# Patient Record
Sex: Female | Born: 1965 | Race: White | Hispanic: No | Smoking: Former smoker
Health system: Southern US, Community
[De-identification: ages and names within clinical notes are randomized; demographics above are authoritative.]

## PROBLEM LIST (undated history)

## (undated) DIAGNOSIS — M1990 Unspecified osteoarthritis, unspecified site: Secondary | ICD-10-CM

## (undated) DIAGNOSIS — F419 Anxiety disorder, unspecified: Secondary | ICD-10-CM

## (undated) DIAGNOSIS — I119 Hypertensive heart disease without heart failure: Secondary | ICD-10-CM

## (undated) DIAGNOSIS — K219 Gastro-esophageal reflux disease without esophagitis: Secondary | ICD-10-CM

## (undated) DIAGNOSIS — Z8719 Personal history of other diseases of the digestive system: Secondary | ICD-10-CM

## (undated) DIAGNOSIS — K259 Gastric ulcer, unspecified as acute or chronic, without hemorrhage or perforation: Secondary | ICD-10-CM

## (undated) DIAGNOSIS — I10 Essential (primary) hypertension: Secondary | ICD-10-CM

## (undated) DIAGNOSIS — E785 Hyperlipidemia, unspecified: Secondary | ICD-10-CM

## (undated) DIAGNOSIS — I2510 Atherosclerotic heart disease of native coronary artery without angina pectoris: Secondary | ICD-10-CM

## (undated) DIAGNOSIS — Z720 Tobacco use: Secondary | ICD-10-CM

## (undated) DIAGNOSIS — K2270 Barrett's esophagus without dysplasia: Secondary | ICD-10-CM

## (undated) HISTORY — DX: Anxiety disorder, unspecified: F41.9

---
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
## 1988-08-16 HISTORY — PX: TUBAL LIGATION: SHX77

## 1997-12-19 ENCOUNTER — Emergency Department (HOSPITAL_COMMUNITY): Admission: EM | Admit: 1997-12-19 | Discharge: 1997-12-19 | Payer: Self-pay | Admitting: Cardiology

## 2002-12-24 ENCOUNTER — Encounter: Payer: Self-pay | Admitting: Emergency Medicine

## 2002-12-24 ENCOUNTER — Emergency Department (HOSPITAL_COMMUNITY): Admission: EM | Admit: 2002-12-24 | Discharge: 2002-12-24 | Payer: Self-pay | Admitting: Emergency Medicine

## 2004-10-23 ENCOUNTER — Emergency Department (HOSPITAL_COMMUNITY): Admission: EM | Admit: 2004-10-23 | Discharge: 2004-10-23 | Payer: Self-pay | Admitting: Emergency Medicine

## 2004-10-25 ENCOUNTER — Emergency Department (HOSPITAL_COMMUNITY): Admission: EM | Admit: 2004-10-25 | Discharge: 2004-10-25 | Payer: Self-pay | Admitting: Emergency Medicine

## 2011-03-10 ENCOUNTER — Emergency Department: Payer: Self-pay | Admitting: Emergency Medicine

## 2011-03-15 ENCOUNTER — Ambulatory Visit: Payer: Self-pay | Admitting: Internal Medicine

## 2012-03-03 ENCOUNTER — Emergency Department (HOSPITAL_COMMUNITY): Payer: Self-pay

## 2012-03-03 ENCOUNTER — Encounter (HOSPITAL_COMMUNITY): Payer: Self-pay | Admitting: *Deleted

## 2012-03-03 ENCOUNTER — Emergency Department (HOSPITAL_COMMUNITY)
Admission: EM | Admit: 2012-03-03 | Discharge: 2012-03-03 | Disposition: A | Discharge status: Home / Self Care | Payer: Self-pay | Attending: Emergency Medicine | Admitting: Emergency Medicine

## 2012-03-03 DIAGNOSIS — X58XXXA Exposure to other specified factors, initial encounter: Secondary | ICD-10-CM | POA: Insufficient documentation

## 2012-03-03 DIAGNOSIS — S139XXA Sprain of joints and ligaments of unspecified parts of neck, initial encounter: Secondary | ICD-10-CM | POA: Insufficient documentation

## 2012-03-03 DIAGNOSIS — S161XXA Strain of muscle, fascia and tendon at neck level, initial encounter: Secondary | ICD-10-CM

## 2012-03-03 DIAGNOSIS — F17200 Nicotine dependence, unspecified, uncomplicated: Secondary | ICD-10-CM | POA: Insufficient documentation

## 2012-03-03 DIAGNOSIS — I10 Essential (primary) hypertension: Secondary | ICD-10-CM | POA: Insufficient documentation

## 2012-03-03 HISTORY — DX: Essential (primary) hypertension: I10

## 2012-03-03 MED ORDER — DIAZEPAM 5 MG PO TABS: 5.0000 mg | ORAL_TABLET | Freq: Three times a day (TID) | ORAL | Status: AC | PRN

## 2012-03-03 MED ORDER — IBUPROFEN 800 MG PO TABS
800.0000 mg | ORAL_TABLET | Freq: Once | ORAL | Status: AC
  Administered 2012-03-03: 800 mg via ORAL
  Filled 2012-03-03: qty 1

## 2012-03-03 MED ORDER — IBUPROFEN 600 MG PO TABS: 600.0000 mg | ORAL_TABLET | Freq: Four times a day (QID) | ORAL | Status: AC | PRN

## 2012-03-03 MED ORDER — HYDROCODONE-ACETAMINOPHEN 5-325 MG PO TABS: 1.0000 | ORAL_TABLET | ORAL | Status: AC | PRN

## 2012-03-03 MED ORDER — HYDROCODONE-ACETAMINOPHEN 5-325 MG PO TABS
1.0000 | ORAL_TABLET | Freq: Once | ORAL | Status: AC
  Administered 2012-03-03: 1 via ORAL
  Filled 2012-03-03: qty 1

## 2012-03-03 NOTE — ED Notes (Signed)
Family at bedside. 

## 2012-03-03 NOTE — ED Notes (Signed)
Pain in neck that radiates to head and rt shoulder.  Push mowing yesterday and felt a pop in her neck and had onset of pain.
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
## 2012-03-07 NOTE — ED Provider Notes (Signed)
Medical screening examination/treatment/procedure(s) were performed by non-physician practitioner and as supervising physician I was immediately available for consultation/collaboration.   Shelda Jakes, MD 03/07/12 2157

## 2012-08-16 HISTORY — PX: TOTAL ABDOMINAL HYSTERECTOMY: SHX209

## 2012-09-30 ENCOUNTER — Emergency Department (HOSPITAL_COMMUNITY)
Admission: EM | Admit: 2012-09-30 | Discharge: 2012-09-30 | Disposition: A | Discharge status: Home / Self Care | Payer: BC Managed Care – PPO | Attending: Emergency Medicine | Admitting: Emergency Medicine

## 2012-09-30 ENCOUNTER — Encounter (HOSPITAL_COMMUNITY): Payer: Self-pay | Admitting: Emergency Medicine

## 2012-09-30 ENCOUNTER — Emergency Department (HOSPITAL_COMMUNITY): Payer: BC Managed Care – PPO

## 2012-09-30 DIAGNOSIS — I10 Essential (primary) hypertension: Secondary | ICD-10-CM | POA: Insufficient documentation

## 2012-09-30 DIAGNOSIS — J40 Bronchitis, not specified as acute or chronic: Secondary | ICD-10-CM

## 2012-09-30 DIAGNOSIS — J3489 Other specified disorders of nose and nasal sinuses: Secondary | ICD-10-CM | POA: Insufficient documentation

## 2012-09-30 DIAGNOSIS — R093 Abnormal sputum: Secondary | ICD-10-CM | POA: Insufficient documentation

## 2012-09-30 DIAGNOSIS — F17200 Nicotine dependence, unspecified, uncomplicated: Secondary | ICD-10-CM | POA: Insufficient documentation

## 2012-09-30 DIAGNOSIS — R51 Headache: Secondary | ICD-10-CM | POA: Insufficient documentation

## 2012-09-30 DIAGNOSIS — J029 Acute pharyngitis, unspecified: Secondary | ICD-10-CM | POA: Insufficient documentation

## 2012-09-30 DIAGNOSIS — R110 Nausea: Secondary | ICD-10-CM | POA: Insufficient documentation

## 2012-09-30 DIAGNOSIS — R0789 Other chest pain: Secondary | ICD-10-CM | POA: Insufficient documentation

## 2012-09-30 DIAGNOSIS — J209 Acute bronchitis, unspecified: Secondary | ICD-10-CM | POA: Insufficient documentation

## 2012-09-30 DIAGNOSIS — H9209 Otalgia, unspecified ear: Secondary | ICD-10-CM | POA: Insufficient documentation

## 2012-09-30 DIAGNOSIS — R109 Unspecified abdominal pain: Secondary | ICD-10-CM | POA: Insufficient documentation

## 2012-09-30 DIAGNOSIS — R42 Dizziness and giddiness: Secondary | ICD-10-CM | POA: Insufficient documentation

## 2012-09-30 DIAGNOSIS — R197 Diarrhea, unspecified: Secondary | ICD-10-CM | POA: Insufficient documentation

## 2012-09-30 DIAGNOSIS — Z79899 Other long term (current) drug therapy: Secondary | ICD-10-CM | POA: Insufficient documentation

## 2012-09-30 MED ORDER — AZITHROMYCIN 250 MG PO TABS: 250.0000 mg | ORAL_TABLET | Freq: Every day | ORAL | Status: DC

## 2012-09-30 MED ORDER — GUAIFENESIN-CODEINE 100-10 MG/5ML PO SYRP: 5.0000 mL | ORAL_SOLUTION | Freq: Three times a day (TID) | ORAL | Status: DC | PRN

## 2012-09-30 NOTE — ED Provider Notes (Signed)
Medical screening examination/treatment/procedure(s) were performed by non-physician practitioner and as supervising physician I was immediately available for consultation/collaboration. Devoria Albe, MD, Armando Gang   Ward Givens, MD 09/30/12 [phone]

## 2012-09-30 NOTE — ED Notes (Signed)
Pt c/o productive cough with yellow phlegm and ear pain x 4 days

## 2012-09-30 NOTE — ED Provider Notes (Signed)
History     CSN: [id number]  Arrival date & time 09/30/12  4098   First MD Initiated Contact with Patient 09/30/12 1008      Chief Complaint  Patient presents with  . Cough    HPI Chauna Osoria is a 47 y.o. female who presents to the ED with a cough. The cough started 4 days ago. She describes the cough as deep productive with yellow/green sputum. Coughs until she feels like she is going to vomit. Associated symptoms include nasal congestion, watery eyes and ear ache. Has had similar problem before. Smokes about a half pack per day. The history was provided by the patient.  Past Medical History  Diagnosis Date  . Hypertension     Past Surgical History  Procedure Laterality Date  . Abdominal hysterectomy    . Hernia repair      History reviewed. No pertinent family history.  History  Substance Use Topics  . Smoking status: Current Every Day Smoker  . Smokeless tobacco: Not on file  . Alcohol Use: Yes     Comment: occ    OB History   Grav Para Term Preterm Abortions TAB SAB Ect Mult Living                  Review of Systems  Constitutional: Negative for fever, chills, diaphoresis and fatigue.  HENT: Positive for ear pain, congestion, sore throat, sneezing and sinus pressure. Negative for facial swelling, neck pain, neck stiffness and dental problem.   Eyes: Negative for photophobia, pain and discharge.  Respiratory: Positive for cough and chest tightness. Negative for wheezing.   Cardiovascular: Negative for chest pain and palpitations.  Gastrointestinal: Positive for nausea, abdominal pain and diarrhea. Negative for vomiting, constipation and abdominal distention.  Genitourinary: Negative for dysuria, frequency, flank pain and difficulty urinating.  Musculoskeletal: Negative for myalgias, back pain and gait problem.  Skin: Negative for color change and rash.  Neurological: Positive for dizziness and headaches. Negative for speech difficulty, weakness,  light-headedness and numbness.  Psychiatric/Behavioral: Negative for confusion and agitation. The patient is not nervous/anxious.     Allergies  Review of patient's allergies indicates no known allergies.  Home Medications   Current Outpatient Rx  Name  Route  Sig  Dispense  Refill  . diazepam (VALIUM) 5 MG tablet   Oral   Take 5 mg by mouth every 8 (eight) hours as needed. For anxiety- usually always takes it every morning and then occasionally throughout day if needed         . naphazoline (CLEAR EYES) 0.012 % ophthalmic solution   Both Eyes   Place 1 drop into both eyes daily as needed. For red eyes         . triamterene-hydrochlorothiazide (MAXZIDE-25) 37.5-25 MG per tablet   Oral   Take 1 tablet by mouth daily.           BP 130/89  Pulse 83  Temp(Src) 98.4 F (36.9 C)  Resp 20  Ht 5\' 4"  (1.626 m)  Wt 140 lb (63.504 kg)  BMI 24.02 kg/m2  SpO2 98%  Physical Exam  Nursing note and vitals reviewed. Constitutional: She is oriented to person, place, and time. She appears well-developed and well-nourished.  HENT:  Head: Normocephalic and atraumatic.  Nose: Rhinorrhea present.  Mouth/Throat: Uvula is midline, oropharynx is clear and moist and mucous membranes are normal.  Right TM occluded by cerumen. Left TM dull  Eyes: EOM are normal. Pupils are equal, round, and  reactive to light.  Neck: Neck supple.  Cardiovascular: Normal rate, regular rhythm and normal heart sounds.   Pulmonary/Chest: Effort normal.  Occasional ronchi  Abdominal: Soft. There is no tenderness.  Musculoskeletal: Normal range of motion. She exhibits no edema.  Neurological: She is alert and oriented to person, place, and time. No cranial nerve deficit.  Skin: Skin is warm and dry.  Psychiatric: She has a normal mood and affect. Her behavior is normal. Judgment and thought content normal.    ED Course  Procedures Labs Reviewed - No data to display Dg Chest 2 View  09/30/2012  *RADIOLOGY  REPORT*  Clinical Data: Cough  CHEST - 2 VIEW  Comparison: None.  Findings: Normal heart.  Bronchitic changes.  Hyperaeration.  No consolidation or mass.  No pneumothorax.  IMPRESSION: No active cardiopulmonary disease.  Changes related to COPD or bronchitis.   Original Report Authenticated By: Jolaine Click, M.D.    Assessment: 47 y.o. female with bronchitis  Plan:  Treat bronchitis   Stop smoking Discussed with the patient and all questioned fully answered. She will return if any problems arise.    Medication List    TAKE these medications       azithromycin 250 MG tablet  Commonly known as:  ZITHROMAX  Take 1 tablet (250 mg total) by mouth daily. Take first 2 tablets together, then 1 every day until finished.     guaiFENesin-codeine 100-10 MG/5ML syrup  Commonly known as:  ROBITUSSIN AC  Take 5 mLs by mouth 3 (three) times daily as needed for cough.      ASK your doctor about these medications       diazepam 5 MG tablet  Commonly known as:  VALIUM  Take 5 mg by mouth every 8 (eight) hours as needed. For anxiety- usually always takes it every morning and then occasionally throughout day if needed     naphazoline 0.012 % ophthalmic solution  Commonly known as:  CLEAR EYES  Place 1 drop into both eyes daily as needed. For red eyes     triamterene-hydrochlorothiazide 37.5-25 MG per tablet  Commonly known as:  MAXZIDE-25  Take 1 tablet by mouth daily.            Janne Napoleon, [place] 09/30/12 1028

## 2012-10-28 ENCOUNTER — Emergency Department (HOSPITAL_COMMUNITY)
Admission: EM | Admit: 2012-10-28 | Discharge: 2012-10-28 | Disposition: A | Discharge status: Home / Self Care | Payer: BC Managed Care – PPO | Attending: Emergency Medicine | Admitting: Emergency Medicine

## 2012-10-28 ENCOUNTER — Encounter (HOSPITAL_COMMUNITY): Payer: Self-pay | Admitting: *Deleted

## 2012-10-28 DIAGNOSIS — F17200 Nicotine dependence, unspecified, uncomplicated: Secondary | ICD-10-CM | POA: Insufficient documentation

## 2012-10-28 DIAGNOSIS — R079 Chest pain, unspecified: Secondary | ICD-10-CM | POA: Insufficient documentation

## 2012-10-28 DIAGNOSIS — R059 Cough, unspecified: Secondary | ICD-10-CM | POA: Insufficient documentation

## 2012-10-28 DIAGNOSIS — J209 Acute bronchitis, unspecified: Secondary | ICD-10-CM | POA: Insufficient documentation

## 2012-10-28 DIAGNOSIS — I10 Essential (primary) hypertension: Secondary | ICD-10-CM | POA: Insufficient documentation

## 2012-10-28 DIAGNOSIS — R05 Cough: Secondary | ICD-10-CM | POA: Insufficient documentation

## 2012-10-28 DIAGNOSIS — Z79899 Other long term (current) drug therapy: Secondary | ICD-10-CM | POA: Insufficient documentation

## 2012-10-28 MED ORDER — MOXIFLOXACIN HCL 400 MG PO TABS: 400.0000 mg | ORAL_TABLET | Freq: Every day | ORAL | Status: DC

## 2012-10-28 MED ORDER — GUAIFENESIN-CODEINE 100-10 MG/5ML PO SYRP: 5.0000 mL | ORAL_SOLUTION | Freq: Three times a day (TID) | ORAL | Status: DC | PRN

## 2012-10-28 NOTE — ED Notes (Signed)
Instructions, prescriptions and f/u information given, reviewed -verbalizes understanding. Left in c/o family for transport home.

## 2012-10-28 NOTE — ED Provider Notes (Signed)
History     This chart was scribed for Geoffery Lyons, MD, MD by Smitty Pluck, ED Scribe. The patient was seen in room [facility] and the patient's care was started at 8:53 AM.   CSN: [id number]  Arrival date & time 10/28/12  9629      No chief complaint on file.   The history is provided by the patient and a relative. No language interpreter was used.   Jocelyn Richard is a 47 y.o. female who presents to the Emergency Department complaining of constant, moderate nasal congestion onset 4 days ago. Pt reports that she has taken Mucinex and albuterol inhaler without relief. She reports having nonproductive cough and chest tightness. She states she had similar symptoms 2 weeks ago, diagnosed with bronchitis and was given abx by PCP with minor relief. She denies sick contact. Pt denies fever, chills, nausea, vomiting, diarrhea, weakness, SOB and any other pain.   Past Medical History  Diagnosis Date  . Hypertension     Past Surgical History  Procedure Laterality Date  . Abdominal hysterectomy    . Hernia repair      No family history on file.  History  Substance Use Topics  . Smoking status: Current Every Day Smoker  . Smokeless tobacco: Not on file  . Alcohol Use: Yes     Comment: occ    OB History   Grav Para Term Preterm Abortions TAB SAB Ect Mult Living                  Review of Systems  Constitutional: Negative for fever and chills.  HENT: Positive for congestion.   Respiratory: Positive for cough. Negative for shortness of breath.   Gastrointestinal: Negative for nausea, vomiting and diarrhea.  Neurological: Negative for weakness.  All other systems reviewed and are negative.    Allergies  Review of patient's allergies indicates no known allergies.  Home Medications   Current Outpatient Rx  Name  Route  Sig  Dispense  Refill  . azithromycin (ZITHROMAX) 250 MG tablet   Oral   Take 1 tablet (250 mg total) by mouth daily. Take first 2 tablets together, then  1 every day until finished.   6 tablet   0   . diazepam (VALIUM) 5 MG tablet   Oral   Take 5 mg by mouth every 8 (eight) hours as needed. For anxiety- usually always takes it every morning and then occasionally throughout day if needed         . guaiFENesin-codeine (ROBITUSSIN AC) 100-10 MG/5ML syrup   Oral   Take 5 mLs by mouth 3 (three) times daily as needed for cough.   120 mL   0   . naphazoline (CLEAR EYES) 0.012 % ophthalmic solution   Both Eyes   Place 1 drop into both eyes daily as needed. For red eyes         . triamterene-hydrochlorothiazide (MAXZIDE-25) 37.5-25 MG per tablet   Oral   Take 1 tablet by mouth daily.           BP 139/99  Pulse 82  Temp(Src) 98.1 F (36.7 C) (Oral)  Resp 16  Ht 5\' 4"  (1.626 m)  Wt 140 lb (63.504 kg)  BMI 24.02 kg/m2  SpO2 96%  Physical Exam  Nursing note and vitals reviewed. Constitutional: She is oriented to person, place, and time. She appears well-developed and well-nourished. No distress.  HENT:  Head: Normocephalic and atraumatic.  Eyes: Conjunctivae are normal.  Neck:  Normal range of motion. Neck supple.  Cardiovascular: Normal rate, regular rhythm and normal heart sounds.   Pulmonary/Chest: Effort normal and breath sounds normal. No respiratory distress. She has no wheezes. She has no rales.  Neurological: She is alert and oriented to person, place, and time.  Skin: Skin is warm and dry.  Psychiatric: She has a normal mood and affect. Her behavior is normal.    ED Course  Procedures (including critical care time) DIAGNOSTIC STUDIES: Oxygen Saturation is 96% on room air, adequate by my interpretation.    COORDINATION OF CARE: 8:55 AM Discussed ED treatment with pt and pt agrees.     Labs Reviewed - No data to display No results found.   No diagnosis found.    MDM  Will treat for recurrent bronchitis.  Return prn.      I personally performed the services described in this documentation, which  was scribed in my presence. The recorded information has been reviewed and is accurate.      Geoffery Lyons, MD 10/28/12 [phone]

## 2012-10-28 NOTE — ED Notes (Signed)
Sinus/nasal congestion with nonproductive cough, chest tightness and fever x 4 days.

## 2012-11-24 ENCOUNTER — Ambulatory Visit: Payer: Self-pay | Admitting: Family Medicine

## 2012-12-06 ENCOUNTER — Ambulatory Visit: Payer: Self-pay | Admitting: Family Medicine

## 2012-12-14 HISTORY — PX: ESOPHAGOGASTRODUODENOSCOPY: SHX1529

## 2013-01-04 ENCOUNTER — Ambulatory Visit: Payer: Self-pay | Admitting: Gastroenterology

## 2013-01-05 LAB — PATHOLOGY REPORT

## 2013-02-07 ENCOUNTER — Ambulatory Visit: Payer: Self-pay | Admitting: Obstetrics and Gynecology

## 2013-02-07 LAB — BASIC METABOLIC PANEL
Anion Gap: 6 — ABNORMAL LOW (ref 7–16)
Co2: 27 mmol/L (ref 21–32)
EGFR (Non-African Amer.): >60
Glucose: 102 mg/dL — ABNORMAL HIGH (ref 65–99)
Osmolality: 276 (ref 275–301)
Potassium: 3.7 mmol/L (ref 3.5–5.1)
Sodium: 138 mmol/L (ref 136–145)

## 2013-02-12 ENCOUNTER — Ambulatory Visit: Payer: Self-pay | Admitting: Obstetrics and Gynecology

## 2013-02-13 LAB — HEMOGLOBIN: HGB: 12.3 g/dL (ref 12.0–16.0)

## 2013-09-17 ENCOUNTER — Ambulatory Visit: Payer: Self-pay | Admitting: Gastroenterology

## 2013-09-20 LAB — PATHOLOGY REPORT

## 2014-07-28 ENCOUNTER — Emergency Department (HOSPITAL_COMMUNITY): Payer: 59

## 2014-07-28 ENCOUNTER — Emergency Department (HOSPITAL_COMMUNITY)
Admission: EM | Admit: 2014-07-28 | Discharge: 2014-07-28 | Disposition: A | Discharge status: Home / Self Care | Payer: 59 | Attending: Emergency Medicine | Admitting: Emergency Medicine

## 2014-07-28 ENCOUNTER — Encounter (HOSPITAL_COMMUNITY): Payer: Self-pay | Admitting: Emergency Medicine

## 2014-07-28 DIAGNOSIS — K279 Peptic ulcer, site unspecified, unspecified as acute or chronic, without hemorrhage or perforation: Secondary | ICD-10-CM | POA: Diagnosis not present

## 2014-07-28 DIAGNOSIS — R0602 Shortness of breath: Secondary | ICD-10-CM | POA: Diagnosis not present

## 2014-07-28 DIAGNOSIS — R1013 Epigastric pain: Secondary | ICD-10-CM

## 2014-07-28 DIAGNOSIS — Z79899 Other long term (current) drug therapy: Secondary | ICD-10-CM | POA: Insufficient documentation

## 2014-07-28 DIAGNOSIS — K2270 Barrett's esophagus without dysplasia: Secondary | ICD-10-CM | POA: Diagnosis not present

## 2014-07-28 DIAGNOSIS — Z720 Tobacco use: Secondary | ICD-10-CM | POA: Insufficient documentation

## 2014-07-28 DIAGNOSIS — I10 Essential (primary) hypertension: Secondary | ICD-10-CM | POA: Insufficient documentation

## 2014-07-28 DIAGNOSIS — R0789 Other chest pain: Secondary | ICD-10-CM | POA: Diagnosis present

## 2014-07-28 HISTORY — DX: Barrett's esophagus without dysplasia: K22.70

## 2014-07-28 HISTORY — DX: Gastric ulcer, unspecified as acute or chronic, without hemorrhage or perforation: K25.9

## 2014-07-28 LAB — COMPREHENSIVE METABOLIC PANEL
ALBUMIN: 3.9 g/dL (ref 3.5–5.2)
ALT: 27 U/L (ref 0–35)
ANION GAP: 13 (ref 5–15)
AST: 16 U/L (ref 0–37)
Alkaline Phosphatase: 87 U/L (ref 39–117)
BILIRUBIN TOTAL: 0.3 mg/dL (ref 0.3–1.2)
BUN: 13 mg/dL (ref 6–23)
CALCIUM: 9.3 mg/dL (ref 8.4–10.5)
CO2: 23 mEq/L (ref 19–32)
CREATININE: 0.69 mg/dL (ref 0.50–1.10)
Chloride: 103 mEq/L (ref 96–112)
GFR calc Af Amer: >90 mL/min (ref >90)
GFR calc non Af Amer: >90 mL/min (ref >90)
Glucose, Bld: 91 mg/dL (ref 70–99)
Potassium: 3.9 mEq/L (ref 3.7–5.3)
Sodium: 139 mEq/L (ref 137–147)
TOTAL PROTEIN: 6.9 g/dL (ref 6.0–8.3)

## 2014-07-28 LAB — LIPASE, BLOOD: LIPASE: 35 U/L (ref 11–59)

## 2014-07-28 LAB — CBC WITH DIFFERENTIAL/PLATELET
BASOS PCT: 1 % (ref 0–1)
Basophils Absolute: 0.1 10*3/uL (ref 0.0–0.1)
EOS ABS: 0.1 10*3/uL (ref 0.0–0.7)
EOS PCT: 2 % (ref 0–5)
HEMATOCRIT: 41 % (ref 36.0–46.0)
HEMOGLOBIN: 13.8 g/dL (ref 12.0–15.0)
Lymphocytes Relative: 40 % (ref 12–46)
Lymphs Abs: 3.1 10*3/uL (ref 0.7–4.0)
MCH: 30.9 pg (ref 26.0–34.0)
MCHC: 33.7 g/dL (ref 30.0–36.0)
MCV: 91.9 fL (ref 78.0–100.0)
MONO ABS: 1 10*3/uL (ref 0.1–1.0)
MONOS PCT: 12 % (ref 3–12)
Neutro Abs: 3.6 10*3/uL (ref 1.7–7.7)
Neutrophils Relative %: 45 % (ref 43–77)
Platelets: 296 10*3/uL (ref 150–400)
RBC: 4.46 MIL/uL (ref 3.87–5.11)
RDW: 13.8 % (ref 11.5–15.5)
WBC: 7.9 10*3/uL (ref 4.0–10.5)

## 2014-07-28 LAB — TROPONIN I

## 2014-07-28 MED ORDER — FAMOTIDINE IN NACL 20-0.9 MG/50ML-% IV SOLN
20.0000 mg | Freq: Once | INTRAVENOUS | Status: AC
  Administered 2014-07-28: 20 mg via INTRAVENOUS
  Filled 2014-07-28: qty 50

## 2014-07-28 MED ORDER — PANTOPRAZOLE SODIUM 20 MG PO TBEC: 20.0000 mg | DELAYED_RELEASE_TABLET | Freq: Every day | ORAL | Status: DC

## 2014-07-28 MED ORDER — PANTOPRAZOLE SODIUM 40 MG IV SOLR
40.0000 mg | INTRAVENOUS | Status: AC
  Administered 2014-07-28: 40 mg via INTRAVENOUS
  Filled 2014-07-28: qty 40

## 2014-07-28 MED ORDER — FAMOTIDINE 20 MG PO TABS: 20.0000 mg | ORAL_TABLET | Freq: Two times a day (BID) | ORAL | Status: DC

## 2014-07-28 MED ORDER — GI COCKTAIL ~~LOC~~
30.0000 mL | Freq: Once | ORAL | Status: AC
  Administered 2014-07-28: 30 mL via ORAL
  Filled 2014-07-28: qty 30

## 2014-07-28 MED ORDER — SUCRALFATE 1 G PO TABS: 1.0000 g | ORAL_TABLET | Freq: Three times a day (TID) | ORAL | Status: DC

## 2014-07-28 NOTE — ED Notes (Signed)
PT c/o central chest pain and epigastric pain with nausea that started last night. PT reports hx of 4 stomach ulcers. PT also reports some diaphoresis but denies any SOB, vomiting or dizziness.

## 2014-07-28 NOTE — Discharge Instructions (Signed)
Please call your doctor for a followup appointment within 24-48 hours. When you talk to your doctor please let them know that you were seen in the emergency department and have them acquire all of your records so that they can discuss the findings with you and formulate a treatment plan to fully care for your new and ongoing problems. ° °

## 2014-07-28 NOTE — ED Provider Notes (Signed)
CSN: [id number]     Arrival date & time 07/28/14  1144 History  This chart was scribed for Vida Roller, MD by Tonye Royalty, ED Scribe. This patient was seen in room APA07/APA07 and the patient's care was started at 11:59 AM.    Chief Complaint  Patient presents with  . Chest Pain   The history is provided by the patient. No language interpreter was used.    Symptoms were gradual in onset Symptoms are constant Symptoms are improved with nothing Made worse with nothing Associated symptoms include nausea, SOB    HPI Comments: Jocelyn Richard is a 48 y.o. female who presents to the Emergency Department complaining of constant chest pain with gradual onset at 2000 last night. She believes her pain is heart burn. She describes her pain as burning and sharp as well. She states she similar pain almost daily, but last night it became persistent. She reports associated nausea and mild SOB. She denies history of cardiac problems. She reports history of stomach ulcers and Barrett's esophagus. She states she was taking Protonix but is not longer taking medication for stomach for the past 6 months per her doctor. She denies heavy use of ASA, Ibuprofen, or alcohol. She denies vomiting, leg swelling above baseline, cough, or fever.    Past Medical History  Diagnosis Date  . Hypertension   . Stomach ulcer   . Esophagus, Barrett's    Past Surgical History  Procedure Laterality Date  . Abdominal hysterectomy    . Hernia repair     No family history on file. History  Substance Use Topics  . Smoking status: Current Every Day Smoker    Types: Cigarettes  . Smokeless tobacco: Not on file  . Alcohol Use: Yes     Comment: occ   OB History    No data available     Review of Systems  Constitutional: Negative for fever.  Respiratory: Positive for shortness of breath. Negative for cough.   Cardiovascular: Positive for chest pain. Negative for leg swelling (baseline).  Gastrointestinal:  Positive for nausea. Negative for vomiting.  All other systems reviewed and are negative.     Allergies  Review of patient's allergies indicates no known allergies.  Home Medications   Prior to Admission medications   Medication Sig Start Date End Date Taking? Authorizing Provider  acetaminophen (TYLENOL) 650 MG CR tablet Take 650 mg by mouth every 8 (eight) hours as needed for pain.   Yes Historical Provider, MD  albuterol (PROVENTIL HFA;VENTOLIN HFA) 108 (90 BASE) MCG/ACT inhaler Inhale 2 puffs into the lungs every 6 (six) hours as needed for wheezing.   Yes Historical Provider, MD  Calcium Carbonate-Vitamin D (CALCIUM + D PO) Take 1 tablet by mouth daily.   Yes Historical Provider, MD  dextromethorphan-guaiFENesin (MUCINEX DM) 30-600 MG per 12 hr tablet Take 1 tablet by mouth every 12 (twelve) hours as needed (for cough/congestion).   Yes Historical Provider, MD  Multiple Vitamin (MULTIVITAMIN WITH MINERALS) TABS tablet Take 1 tablet by mouth daily.   Yes Historical Provider, MD  Polyvinyl Alcohol-Povidone (CLEAR EYES NATURAL TEARS OP) Apply 1 drop to eye daily as needed (for red or dry eyes).   Yes Historical Provider, MD  triamterene-hydrochlorothiazide (MAXZIDE-25) 37.5-25 MG per tablet Take 1 tablet by mouth daily.   Yes Historical Provider, MD  famotidine (PEPCID) 20 MG tablet Take 1 tablet (20 mg total) by mouth 2 (two) times daily. 07/28/14   Vida Roller, MD  guaiFENesin-codeine (ROBITUSSIN AC) 100-10 MG/5ML syrup Take 5 mLs by mouth 3 (three) times daily as needed for cough. Patient not taking: Reported on 07/28/2014 09/30/12   Janne NapoleonHope M Neese, NP  guaiFENesin-codeine [facility](ROBITUSSIN AC) 100-10 MG/5ML syrup Take 5 mLs by mouth 3 (three) times daily as needed for cough. Patient not taking: Reported on 07/28/2014 10/28/12   Geoffery Lyonsouglas Delo, MD  moxifloxacin (AVELOX) 400 MG tablet Take 1 tablet (400 mg total) by mouth daily. Patient not taking: Reported on 07/28/2014 10/28/12   Geoffery Lyonsouglas Delo,  MD  pantoprazole (PROTONIX) 20 MG tablet Take 1 tablet (20 mg total) by mouth daily. 07/28/14   Vida RollerBrian D Gerold Sar, MD  sucralfate (CARAFATE) 1 G tablet Take 1 tablet (1 g total) by mouth 4 (four) times daily -  with meals and at bedtime. 07/28/14   Vida RollerBrian D Dayquan Buys, MD   BP 149/82 mmHg  Pulse 64  Temp(Src) 98.1 F (36.7 C) (Oral)  Resp 15  Ht 5\' 4"  (1.626 m)  Wt 155 lb (70.308 kg)  BMI 26.59 kg/m2  SpO2 95% Physical Exam  Constitutional: She appears well-developed and well-nourished. No distress.  HENT:  Head: Normocephalic and atraumatic.  Mouth/Throat: Oropharynx is clear and moist. No oropharyngeal exudate.  Eyes: Conjunctivae and EOM are normal. Pupils are equal, round, and reactive to light. Right eye exhibits no discharge. Left eye exhibits no discharge. No scleral icterus.  Neck: Normal range of motion. Neck supple. No JVD present. No thyromegaly present.  Cardiovascular: Normal rate, regular rhythm, normal heart sounds and intact distal pulses.  Exam reveals no gallop and no friction rub.   No murmur heard. Pulmonary/Chest: Effort normal and breath sounds normal. No respiratory distress. She has no wheezes. She has no rales.  Abdominal: Soft. Bowel sounds are normal. She exhibits no distension and no mass. There is tenderness (epigastric, no other abdominal tenderness). There is no guarding.  Musculoskeletal: Normal range of motion. She exhibits no edema or tenderness.  Lymphadenopathy:    She has no cervical adenopathy.  Neurological: She is alert. Coordination normal.  Skin: Skin is warm and dry. No rash noted. No erythema.  Psychiatric: She has a normal mood and affect. Her behavior is normal.  Nursing note and vitals reviewed.   ED Course  Procedures (including critical care time)  DIAGNOSTIC STUDIES: Oxygen Saturation is 97% on room air, normal by my interpretation.    COORDINATION OF CARE: 12:04 PM Discussed treatment plan with patient at beside, the patient agrees  with the plan and has no further questions at this time.   Labs Review Labs Reviewed  CBC WITH DIFFERENTIAL  COMPREHENSIVE METABOLIC PANEL  TROPONIN I  LIPASE, BLOOD    Imaging Review Dg Chest 2 View  07/28/2014   CLINICAL DATA:  Chest pain.  Epigastric pain.  EXAM: CHEST  2 VIEW  COMPARISON:  09/30/2012.  FINDINGS: The heart size and mediastinal contours are within normal limits. Lungs are mildly hyperexpanded but clear. No pleural effusion or pneumothorax. The visualized skeletal structures are unremarkable.  IMPRESSION: No active cardiopulmonary disease.   Electronically Signed   By: Amie Portlandavid  Ormond M.D.   On: 07/28/2014 12:31     EKG Interpretation   Date/Time:  Sunday July 28 2014 11:52:24 EST Ventricular Rate:  72 PR Interval:  133 QRS Duration: 78 QT Interval:  417 QTC Calculation: 456 R Axis:   75 Text Interpretation:  Sinus rhythm Normal ECG No old tracing to compare  Confirmed by Gricel Copen  MD, Samanvitha Germany ([id number]) on  07/28/2014 11:57:54 AM      MDM   Final diagnoses:  Epigastric pain  PUD (peptic ulcer disease)    Despite being off of medication the patient has had ongoing epigastric discomfort which is gradually worsening and likely consistent with peptic ulcer disease given her negative labs, negative workup for pancreatitis and a normal troponin and EKG.  X-ray negative  Meds given in ED:  Medications  famotidine (PEPCID) IVPB 20 mg (20 mg Intravenous New Bag/Given 07/28/14 1210)  pantoprazole (PROTONIX) injection 40 mg (40 mg Intravenous Given 07/28/14 1210)  gi cocktail (Maalox,Lidocaine,Donnatal) (30 mLs Oral Given 07/28/14 1210)    New Prescriptions   FAMOTIDINE (PEPCID) 20 MG TABLET    Take 1 tablet (20 mg total) by mouth 2 (two) times daily.   PANTOPRAZOLE (PROTONIX) 20 MG TABLET    Take 1 tablet (20 mg total) by mouth daily.   SUCRALFATE (CARAFATE) 1 G TABLET    Take 1 tablet (1 g total) by mouth 4 (four) times daily -  with meals and at bedtime.        I personally performed the services described in this documentation, which was scribed in my presence. The recorded information has been reviewed and is accurate.   Vida RollerBrian D Callaghan Laverdure, MD 07/28/14 1344

## 2014-12-06 NOTE — Op Note (Signed)
PATIENT NAME:  Jocelyn Richard, Jocelyn Richard MR#:  [id number] DATE OF BIRTH:  Jan 22, 1966  DATE OF PROCEDURE:  02/12/2013  PREOPERATIVE DIAGNOSES: 1. Chronic pelvic pain.  2. History of endometriosis.  3. Status post laparoscopic supracervical hysterectomy.  4. Cervical prolapse.   POSTOPERATIVE DIAGNOSIS:  1. Chronic pelvic pain. 2. History of endometriosis. 3. Status post laparoscopic supracervical hysterectomy 4. Cervical prolapse.  OPERATIVE PROCEDURE: 1. Vaginal trachelectomy.  2. Laparoscopic bilateral salpingo-oophorectomy.   SURGEON: Herold Harms, M.D.   FIRST ASSISTANT: Earlie Lou, NP Lajean Silvius, [place] student  ANESTHESIA: General endotracheal.   INDICATIONS: The patient is a 49 year old white female, para 3-0-0, status post LSH in the past for symptomatic endometriosis, who presents now for surgical management of her chronic pelvic pain. The patient does have bilateral adnexal pain as well as deep thrusting dyspareunia. She is desiring definitive surgery.   FINDINGS AT SURGERY: Mild cervical prolapse to the mid vagina. Laparoscopy demonstrated grossly normal-appearing ovaries bilaterally. Fallopian tubes were grossly normal as well.   DESCRIPTION OF PROCEDURE: The patient was brought to the Operating Room where she was placed in the supine position. General endotracheal anesthesia was induced without difficulty.  She was placed in the dorsal lithotomy position using the bumblebee stirrups. A ChloraPrep and Betadine abdominal, perineal, intravaginal prep and drape was performed in the standard fashion. A Foley catheter was placed and was draining clear yellow urine. After placing patient in the high lithotomy position, a weighted speculum was placed into the vagina and a double-tooth tenaculum was placed on the cervix. Posterior colpotomy was made with Mayo scissors. Uterosacral ligaments were clamped, cut, and stick tied using 0 Vicryl suture. Next, the surface of the cervix  was circumscribed with a scalpel. The remaining cardinal broad ligament complexes were then clamped, cut, and stick tied using 0 chromic sutures. Once adequately mobilized, the cervix was removed from the operative field. The posterior cuff was run with 0 chromic suture in a running baseball stitch manner. The vagina was then closed with simple interrupted sutures of 2-0 chromic. Upon completion of the trachelectomy, the laparoscopic bilateral salpingo-oophorectomy was performed. The patient was placed in the low lithotomy position. A subumbilical horizontal incision 5 mm in length was made. Initially the entry into the abdomen through direct entry was suspect because of possible peritoneal adhesions. An attempt at left upper quadrant entry was made but discontinued. Verification through the subumbilical port did confirm adequate entry with no significant adhesions being identified. Two other ports were placed in the lower abdomen under direct visualization. The left lower quadrant port with an 11 mm port. Right lower quadrant port was a 5 mm port. The IP Ligaments were clamped, coagulated, and cut using the Harmonic scalpel. Good hemostasis was noted.  This was done bilaterally. Both tubes, and ovaries were placed in an Endo Catch bag removal device and removed from their operative fields through the 11 mm port site. Further evaluation of the pelvis demonstrated good hemostasis. The procedure was then terminated with all instrumentation being removed from the abdominopelvic cavity. The pneumoperitoneum was released. The incisions were closed with 4-0 Vicryl on the skin. Dermabond was placed over the incisions. The patient was then awakened, mobilized and taken to recovery room in satisfactory condition.   ESTIMATED BLOOD LOSS: 15 mL.  IV FLUIDS: 1500 mL.  COMPLICATIONS:  None.   COUNTS:  All instruments, needle and sponge counts were verified as correct.   The patient did receive Ancef antibiotic  prophylaxis.  ____________________________ Prentice DockerMartin A. Melynda Krzywicki, MD mad:rw D: 02/12/2013 17:34:53 ET T: 02/12/2013 20:14:11 ET JOB#: [id number]  cc: Daphine DeutscherMartin A. Kharee Lesesne, MD, <Dictator> [facility] Prentice DockerMARTIN A Cezar Misiaszek MD ELECTRONICALLY SIGNED 02/13/2013 13:39

## 2014-12-13 ENCOUNTER — Encounter (HOSPITAL_COMMUNITY): Payer: Self-pay | Admitting: *Deleted

## 2014-12-13 ENCOUNTER — Emergency Department (HOSPITAL_COMMUNITY)
Admission: EM | Admit: 2014-12-13 | Discharge: 2014-12-13 | Disposition: A | Discharge status: Home / Self Care | Payer: 59 | Attending: Emergency Medicine | Admitting: Emergency Medicine

## 2014-12-13 ENCOUNTER — Emergency Department (HOSPITAL_COMMUNITY): Payer: 59

## 2014-12-13 DIAGNOSIS — K625 Hemorrhage of anus and rectum: Secondary | ICD-10-CM | POA: Insufficient documentation

## 2014-12-13 DIAGNOSIS — Z90710 Acquired absence of both cervix and uterus: Secondary | ICD-10-CM | POA: Diagnosis not present

## 2014-12-13 DIAGNOSIS — Z79899 Other long term (current) drug therapy: Secondary | ICD-10-CM | POA: Insufficient documentation

## 2014-12-13 DIAGNOSIS — Z720 Tobacco use: Secondary | ICD-10-CM | POA: Diagnosis not present

## 2014-12-13 DIAGNOSIS — R1010 Upper abdominal pain, unspecified: Secondary | ICD-10-CM | POA: Diagnosis present

## 2014-12-13 DIAGNOSIS — R1013 Epigastric pain: Secondary | ICD-10-CM | POA: Diagnosis not present

## 2014-12-13 DIAGNOSIS — I10 Essential (primary) hypertension: Secondary | ICD-10-CM | POA: Diagnosis not present

## 2014-12-13 LAB — CBC WITH DIFFERENTIAL/PLATELET
BASOS PCT: 1 % (ref 0–1)
Basophils Absolute: 0.1 10*3/uL (ref 0.0–0.1)
Eosinophils Absolute: 0.1 10*3/uL (ref 0.0–0.7)
Eosinophils Relative: 2 % (ref 0–5)
HCT: 42.6 % (ref 36.0–46.0)
Hemoglobin: 14 g/dL (ref 12.0–15.0)
LYMPHS ABS: 2.6 10*3/uL (ref 0.7–4.0)
Lymphocytes Relative: 35 % (ref 12–46)
MCH: 30.7 pg (ref 26.0–34.0)
MCHC: 32.9 g/dL (ref 30.0–36.0)
MCV: 93.4 fL (ref 78.0–100.0)
Monocytes Absolute: 0.7 10*3/uL (ref 0.1–1.0)
Monocytes Relative: 9 % (ref 3–12)
Neutro Abs: 4 10*3/uL (ref 1.7–7.7)
Neutrophils Relative %: 53 % (ref 43–77)
Platelets: 293 10*3/uL (ref 150–400)
RBC: 4.56 MIL/uL (ref 3.87–5.11)
RDW: 14.2 % (ref 11.5–15.5)
WBC: 7.4 10*3/uL (ref 4.0–10.5)

## 2014-12-13 LAB — COMPREHENSIVE METABOLIC PANEL
ALT: 23 U/L (ref 0–35)
ANION GAP: 7 (ref 5–15)
AST: 17 U/L (ref 0–37)
Albumin: 4.1 g/dL (ref 3.5–5.2)
Alkaline Phosphatase: 88 U/L (ref 39–117)
BUN: 16 mg/dL (ref 6–23)
CO2: 26 mmol/L (ref 19–32)
CREATININE: 0.78 mg/dL (ref 0.50–1.10)
Calcium: 9.1 mg/dL (ref 8.4–10.5)
Chloride: 108 mmol/L (ref 96–112)
GLUCOSE: 85 mg/dL (ref 70–99)
Potassium: 4.3 mmol/L (ref 3.5–5.1)
SODIUM: 141 mmol/L (ref 135–145)
Total Bilirubin: 0.4 mg/dL (ref 0.3–1.2)
Total Protein: 7 g/dL (ref 6.0–8.3)

## 2014-12-13 LAB — URINALYSIS, ROUTINE W REFLEX MICROSCOPIC
Bilirubin Urine: NEGATIVE
Glucose, UA: 100 mg/dL — AB
HGB URINE DIPSTICK: NEGATIVE
KETONES UR: NEGATIVE mg/dL
Leukocytes, UA: NEGATIVE
Nitrite: NEGATIVE
PH: 5.5 (ref 5.0–8.0)
Protein, ur: NEGATIVE mg/dL
Specific Gravity, Urine: <1.005 — ABNORMAL LOW (ref 1.005–1.030)
UROBILINOGEN UA: 0.2 mg/dL (ref 0.0–1.0)

## 2014-12-13 LAB — LIPASE, BLOOD: Lipase: 45 U/L (ref 11–59)

## 2014-12-13 LAB — POC OCCULT BLOOD, ED: Fecal Occult Bld: NEGATIVE

## 2014-12-13 MED ORDER — HYDROCODONE-ACETAMINOPHEN 5-325 MG PO TABS: ORAL_TABLET | ORAL | Status: DC

## 2014-12-13 MED ORDER — IOHEXOL 300 MG/ML  SOLN
100.0000 mL | Freq: Once | INTRAMUSCULAR | Status: AC | PRN
  Administered 2014-12-13: 100 mL via INTRAVENOUS

## 2014-12-13 MED ORDER — PANTOPRAZOLE SODIUM 40 MG IV SOLR
40.0000 mg | Freq: Once | INTRAVENOUS | Status: AC
  Administered 2014-12-13: 40 mg via INTRAVENOUS
  Filled 2014-12-13: qty 40

## 2014-12-13 MED ORDER — MORPHINE SULFATE 4 MG/ML IJ SOLN
4.0000 mg | Freq: Once | INTRAMUSCULAR | Status: AC
  Administered 2014-12-13: 4 mg via INTRAVENOUS
  Filled 2014-12-13: qty 1

## 2014-12-13 MED ORDER — ONDANSETRON HCL 4 MG/2ML IJ SOLN
4.0000 mg | Freq: Once | INTRAMUSCULAR | Status: AC
  Administered 2014-12-13: 4 mg via INTRAVENOUS
  Filled 2014-12-13: qty 2

## 2014-12-13 MED ORDER — SODIUM CHLORIDE 0.9 % IV SOLN
Freq: Once | INTRAVENOUS | Status: AC
  Administered 2014-12-13: 999 mL via INTRAVENOUS

## 2014-12-13 MED ORDER — PANTOPRAZOLE SODIUM 40 MG PO TBEC: 40.0000 mg | DELAYED_RELEASE_TABLET | Freq: Every day | ORAL | Status: DC

## 2014-12-13 NOTE — ED Provider Notes (Signed)
CSN: [id number]     Arrival date & time 12/13/14  0801 History   First MD Initiated Contact with Patient 12/13/14 0813     Chief Complaint  Patient presents with  . Abdominal Pain  . Rectal Bleeding     (Consider location/radiation/quality/duration/timing/severity/associated sxs/prior Treatment) HPI  Jocelyn Richard is a 49 y.o. female who presents to the Emergency Department complaining of gradual onset of abdominal pain and dark stools for 2 days.  She states she began having sharp upper abdominal pains and feeling "bloated." she states the pain is improved with "balled up in a knot" she describes the pain as sharp, persistent.  Nothing makes it better.  She reports hx of Barrett's esophagus and stomach ulcers, has been off of her Protonix and Carafate for approximately 3 months.  Longer longer has a primary or GI physician.  She reports a recent change to a high protein diet and eats lots of green vegatables.  She denies recent abdominal surgery, fever, chest pain, vomiting or Pepto-Bismol.    Past Medical History  Diagnosis Date  . Hypertension   . Stomach ulcer   . Esophagus, Barrett's    Past Surgical History  Procedure Laterality Date  . Abdominal hysterectomy    . Hernia repair     History reviewed. No pertinent family history. History  Substance Use Topics  . Smoking status: Current Every Day Smoker    Types: Cigarettes  . Smokeless tobacco: Not on file  . Alcohol Use: Yes     Comment: occ   OB History    No data available     Review of Systems  Constitutional: Negative for fever, chills and appetite change.  Respiratory: Negative for shortness of breath.   Cardiovascular: Negative for chest pain.  Gastrointestinal: Positive for nausea, abdominal pain, blood in stool and abdominal distention. Negative for vomiting and diarrhea.  Genitourinary: Negative for dysuria, hematuria, flank pain, decreased urine volume and difficulty urinating.  Musculoskeletal: Negative  for back pain.  Skin: Negative for color change and rash.  Neurological: Negative for dizziness, weakness and numbness.  Hematological: Negative for adenopathy.  All other systems reviewed and are negative.     Allergies  Review of patient's allergies indicates no known allergies.  Home Medications   Prior to Admission medications   Medication Sig Start Date End Date Taking? Authorizing Provider  acetaminophen (TYLENOL) 650 MG CR tablet Take 650 mg by mouth every 8 (eight) hours as needed for pain.    Historical Provider, MD  albuterol (PROVENTIL HFA;VENTOLIN HFA) 108 (90 BASE) MCG/ACT inhaler Inhale 2 puffs into the lungs every 6 (six) hours as needed for wheezing.    Historical Provider, MD  Calcium Carbonate-Vitamin D (CALCIUM + D PO) Take 1 tablet by mouth daily.    Historical Provider, MD  dextromethorphan-guaiFENesin (MUCINEX DM) 30-600 MG per 12 hr tablet Take 1 tablet by mouth every 12 (twelve) hours as needed (for cough/congestion).    Historical Provider, MD  famotidine (PEPCID) 20 MG tablet Take 1 tablet (20 mg total) by mouth 2 (two) times daily. 07/28/14   Eber Hong, MD  guaiFENesin-codeine [facility]) 100-10 MG/5ML syrup Take 5 mLs by mouth 3 (three) times daily as needed for cough. Patient not taking: Reported on 07/28/2014 09/30/12   Janne Napoleon, NP  guaiFENesin-codeine [facility]) 100-10 MG/5ML syrup Take 5 mLs by mouth 3 (three) times daily as needed for cough. Patient not taking: Reported on 07/28/2014 10/28/12   Geoffery Lyons, MD  moxifloxacin (AVELOX) 400 MG tablet Take 1 tablet (400 mg total) by mouth daily. Patient not taking: Reported on 07/28/2014 10/28/12   Geoffery Lyonsouglas Delo, MD  Multiple Vitamin (MULTIVITAMIN WITH MINERALS) TABS tablet Take 1 tablet by mouth daily.    Historical Provider, MD  pantoprazole (PROTONIX) 20 MG tablet Take 1 tablet (20 mg total) by mouth daily. 07/28/14   Eber HongBrian Miller, MD  Polyvinyl Alcohol-Povidone (CLEAR EYES NATURAL TEARS OP)  Apply 1 drop to eye daily as needed (for red or dry eyes).    Historical Provider, MD  sucralfate (CARAFATE) 1 G tablet Take 1 tablet (1 g total) by mouth 4 (four) times daily -  with meals and at bedtime. 07/28/14   Eber HongBrian Miller, MD  triamterene-hydrochlorothiazide (MAXZIDE-25) 37.5-25 MG per tablet Take 1 tablet by mouth daily.    Historical Provider, MD   BP 173/98 mmHg  Pulse 86  Temp(Src) 97.9 F (36.6 C) (Oral)  Resp 19  Ht 5\' 4"  (1.626 m)  Wt 160 lb (72.576 kg)  BMI 27.45 kg/m2  SpO2 96% Physical Exam  Constitutional: She is oriented to person, place, and time. She appears well-developed and well-nourished. No distress.  HENT:  Head: Normocephalic and atraumatic.  Mouth/Throat: Oropharynx is clear and moist.  Neck: Normal range of motion. Neck supple.  Cardiovascular: Normal rate, regular rhythm, normal heart sounds and intact distal pulses.   No murmur heard. Pulmonary/Chest: Effort normal and breath sounds normal. No respiratory distress. She exhibits no tenderness.  Abdominal: Soft. Bowel sounds are normal. She exhibits distension. She exhibits no mass. There is tenderness in the epigastric area. There is no rigidity, no rebound, no guarding and no CVA tenderness.  Mildly distended.  Diffuse epigastric tenderness.  No guarding or rebound.  Bowel sounds diminished in all quadrants.    Genitourinary: Rectal exam shows no fissure, no mass, no tenderness and anal tone normal. Guaiac negative stool.  Brownish-red heme negative stool.  No palpable rectal masses.  Musculoskeletal: Normal range of motion. She exhibits no edema.  Neurological: She is alert and oriented to person, place, and time. She exhibits normal muscle tone. Coordination normal.  Skin: Skin is warm and dry.  Nursing note and vitals reviewed.   ED Course  Procedures (including critical care time) Labs Review Labs Reviewed  URINALYSIS, ROUTINE W REFLEX MICROSCOPIC - Abnormal; Notable for the following:     Specific Gravity, Urine <1.005 (*)    Glucose, UA 100 (*)    All other components within normal limits  CBC WITH DIFFERENTIAL/PLATELET  COMPREHENSIVE METABOLIC PANEL  LIPASE, BLOOD  POC OCCULT BLOOD, ED    Imaging Review Ct Abdomen Pelvis W Contrast  12/13/2014   CLINICAL DATA:  Mid abdominal pain for 5 days. Nausea. Rectal bleeding.  EXAM: CT ABDOMEN AND PELVIS WITH CONTRAST  TECHNIQUE: Multidetector CT imaging of the abdomen and pelvis was performed using the standard protocol following bolus administration of intravenous contrast.  CONTRAST:  100mL OMNIPAQUE IOHEXOL 300 MG/ML  SOLN  COMPARISON:  None.  FINDINGS: Lung bases are clear.  No effusions.  Heart is normal size.  Mild diffuse fatty infiltration of the liver. No focal abnormality. Gallbladder, pancreas, spleen, right adrenal gland and right kidney are unremarkable. 17 mm nodule in the left adrenal gland, nonspecific. Small cyst in the midpole of the left kidney. No hydronephrosis.  Scattered descending colonic and sigmoid diverticulosis. No active diverticulitis. Stomach and small bowel are unremarkable. Appendix is visualized and is normal. No free fluid, free air or  adenopathy.  Prior hysterectomy. No adnexal masses. Urinary bladder is unremarkable. Aorta is normal caliber.  No acute bony abnormality or focal bone lesion.  IMPRESSION: Mild diffuse fatty infiltration of the liver.  Scattered left colonic diverticula.  No active diverticulitis.  No acute findings.   Electronically Signed   By: Charlett Nose M.D.   On: 12/13/2014 10:48     EKG Interpretation None      MDM   Final diagnoses:  Epigastric pain     1140  Pt is feeling better.  Labs and CT abd/pelvis are reassuring.  Pt appears stable for d/c, no concerning sx's for acute abdomen at this time.  Pt requesting referral info for GI and PCP.  She will maintain bland diet, given rx for protonix and strict return precautions.     Pauline Aus, PA-C 12/16/14  2301  Gilda Crease, MD 12/17/14 [phone]

## 2014-12-13 NOTE — ED Notes (Signed)
Pt reports abdominal pain and swelling across center of abdomen x1 week, noticed dark tarry stools x2 days. Denies N/V.

## 2014-12-13 NOTE — Discharge Instructions (Signed)

## 2015-01-14 ENCOUNTER — Other Ambulatory Visit (HOSPITAL_COMMUNITY): Payer: Self-pay | Admitting: Physician Assistant

## 2015-01-14 DIAGNOSIS — R109 Unspecified abdominal pain: Secondary | ICD-10-CM

## 2015-01-14 DIAGNOSIS — R609 Edema, unspecified: Secondary | ICD-10-CM

## 2015-01-16 ENCOUNTER — Ambulatory Visit (HOSPITAL_COMMUNITY)
Admission: RE | Admit: 2015-01-16 | Discharge: 2015-01-16 | Disposition: A | Discharge status: Home / Self Care | Payer: 59 | Source: Ambulatory Visit | Attending: Physician Assistant | Admitting: Physician Assistant

## 2015-01-16 DIAGNOSIS — R609 Edema, unspecified: Secondary | ICD-10-CM

## 2015-01-16 DIAGNOSIS — R109 Unspecified abdominal pain: Secondary | ICD-10-CM

## 2015-01-16 DIAGNOSIS — R1010 Upper abdominal pain, unspecified: Secondary | ICD-10-CM | POA: Diagnosis not present

## 2015-01-17 ENCOUNTER — Other Ambulatory Visit (HOSPITAL_COMMUNITY): Payer: Self-pay | Admitting: Physician Assistant

## 2015-01-17 DIAGNOSIS — R1084 Generalized abdominal pain: Secondary | ICD-10-CM

## 2015-01-21 ENCOUNTER — Encounter (HOSPITAL_COMMUNITY)
Admission: RE | Admit: 2015-01-21 | Discharge: 2015-01-21 | Disposition: A | Discharge status: Home / Self Care | Payer: 59 | Source: Ambulatory Visit | Attending: Physician Assistant | Admitting: Physician Assistant

## 2015-01-21 ENCOUNTER — Encounter (HOSPITAL_COMMUNITY): Payer: Self-pay

## 2015-01-21 DIAGNOSIS — R1084 Generalized abdominal pain: Secondary | ICD-10-CM | POA: Diagnosis present

## 2015-01-21 MED ORDER — SODIUM CHLORIDE 0.9 % IJ SOLN
INTRAMUSCULAR | Status: AC
  Filled 2015-01-21: qty 6

## 2015-01-21 MED ORDER — SINCALIDE 5 MCG IJ SOLR
INTRAMUSCULAR | Status: AC
  Administered 2015-01-21: 1.45 ug via INTRAVENOUS
  Filled 2015-01-21: qty 5

## 2015-01-21 MED ORDER — TECHNETIUM TC 99M MEBROFENIN IV KIT
5.0000 | PACK | Freq: Once | INTRAVENOUS | Status: AC | PRN
  Administered 2015-01-21: 5.4 via INTRAVENOUS

## 2015-01-21 MED ORDER — STERILE WATER FOR INJECTION IJ SOLN
INTRAMUSCULAR | Status: AC
  Administered 2015-01-21: 1.45 mL via INTRAVENOUS
  Filled 2015-01-21: qty 10

## 2015-02-27 ENCOUNTER — Encounter (HOSPITAL_COMMUNITY)
Admission: RE | Admit: 2015-02-27 | Discharge: 2015-02-27 | Disposition: A | Discharge status: Home / Self Care | Payer: 59 | Source: Ambulatory Visit | Attending: General Surgery | Admitting: General Surgery

## 2015-02-27 ENCOUNTER — Encounter (HOSPITAL_COMMUNITY): Payer: Self-pay

## 2015-02-27 DIAGNOSIS — K819 Cholecystitis, unspecified: Secondary | ICD-10-CM | POA: Diagnosis not present

## 2015-02-27 DIAGNOSIS — Z01818 Encounter for other preprocedural examination: Secondary | ICD-10-CM | POA: Diagnosis not present

## 2015-02-27 LAB — BASIC METABOLIC PANEL
Anion gap: 14 (ref 5–15)
BUN: 15 mg/dL (ref 6–20)
CO2: 20 mmol/L — ABNORMAL LOW (ref 22–32)
CREATININE: 0.87 mg/dL (ref 0.44–1.00)
Calcium: 9.3 mg/dL (ref 8.9–10.3)
Chloride: 105 mmol/L (ref 101–111)
GFR calc non Af Amer: >60 mL/min (ref >60)
GLUCOSE: 183 mg/dL — AB (ref 65–99)
POTASSIUM: 3.6 mmol/L (ref 3.5–5.1)
Sodium: 139 mmol/L (ref 135–145)

## 2015-02-27 LAB — HEPATIC FUNCTION PANEL
ALT: 25 U/L (ref 14–54)
AST: 24 U/L (ref 15–41)
Albumin: 4.3 g/dL (ref 3.5–5.0)
Alkaline Phosphatase: 98 U/L (ref 38–126)
BILIRUBIN TOTAL: 0.7 mg/dL (ref 0.3–1.2)
Bilirubin, Direct: 0.1 mg/dL (ref 0.1–0.5)
Indirect Bilirubin: 0.6 mg/dL (ref 0.3–0.9)
Total Protein: 6.9 g/dL (ref 6.5–8.1)

## 2015-02-27 LAB — CBC WITH DIFFERENTIAL/PLATELET
Basophils Absolute: 0.1 10*3/uL (ref 0.0–0.1)
Basophils Relative: 1 % (ref 0–1)
Eosinophils Absolute: 0.1 10*3/uL (ref 0.0–0.7)
Eosinophils Relative: 1 % (ref 0–5)
HCT: 44.3 % (ref 36.0–46.0)
Hemoglobin: 14.7 g/dL (ref 12.0–15.0)
LYMPHS ABS: 3.4 10*3/uL (ref 0.7–4.0)
Lymphocytes Relative: 38 % (ref 12–46)
MCH: 30.5 pg (ref 26.0–34.0)
MCHC: 33.2 g/dL (ref 30.0–36.0)
MCV: 91.9 fL (ref 78.0–100.0)
MONO ABS: 0.6 10*3/uL (ref 0.1–1.0)
MONOS PCT: 6 % (ref 3–12)
NEUTROS PCT: 54 % (ref 43–77)
Neutro Abs: 4.9 10*3/uL (ref 1.7–7.7)
PLATELETS: 320 10*3/uL (ref 150–400)
RBC: 4.82 MIL/uL (ref 3.87–5.11)
RDW: 14 % (ref 11.5–15.5)
WBC: 9.1 10*3/uL (ref 4.0–10.5)

## 2015-02-27 NOTE — H&P (Signed)
  NTS SOAP Note  Vital Signs:  Vitals as of: 02/27/2015: Systolic 163: Diastolic 101: Heart Rate 82: Temp 97.63F: Height 305ft 3in: Weight 155Lbs 0 Ounces: Pain Level 8: BMI 27.46  BMI : 27.46 kg/m2  Subjective: This 49 year old female presents for of biliary colic.  Has been having right upper quadrant abdominal pain radiating to her right flank, nausea, fatty food intolerance for several months now.  No fever, chills, jaundice.  U/S and CT scan negative.  HIDA reveals chronic cholecystitis with low ejection fraction and reproducible symptoms with CCK injection.  Review of Symptoms:  Constitutional:unremarkable   headache Eyes:unremarkable   Nose/Mouth/Throat:unremarkable Cardiovascular:  unremarkable Respiratory:unremarkable Gastrointestinabdominal pain, nausea, heartburn, dyspepsia Genitourinary:unremarkable   back pain Skin:unremarkable Hematolgic/Lymphatic:unremarkable   Allergic/Immunologic:unremarkable   Past Medical History:  Reviewed  Past Medical History  Surgical History: TAH Medical Problems: none Allergies: nkda Medications: tramadol for pain   Social History:Reviewed  Social History  Preferred Language: English Race:  Other Ethnicity: Not Hispanic / Latino Age: 49 year Marital Status:  D Alcohol: no   Smoking Status: Current every day smoker reviewed on 02/27/2015 Started Date:  Packs per day: 1.00 Functional Status reviewed on 02/27/2015 ------------------------------------------------ Bathing: Normal Cooking: Normal Dressing: Normal Driving: Normal Eating: Normal Managing Meds: Normal Oral Care: Normal Shopping: Normal Toileting: Normal Transferring: Normal Walking: Normal Cognitive Status reviewed on 02/27/2015 ------------------------------------------------ Attention: Normal Decision Making: Normal Language: Normal Memory: Normal Motor: Normal Perception: Normal Problem Solving: Normal Visual and Spatial:  Normal   Family History:Reviewed  Family Health History Mother, Living; Heart disease;  Father, Living; Heart disease; Diabetes mellitus, unspecified type;     Objective Information: General:Well appearing, well nourished in no distress. no scleral icterus Heart:RRR, no murmur Lungs:  CTA bilaterally, no wheezes, rhonchi, rales.  Breathing unlabored. Abdomen:Soft, tender in right upper quadrant to palpation, ND, no HSM, no masses.  Assessment:Chronic cholecystitis  Diagnoses: 575.11  K81.1 Chronic cholecystitis (Chronic cholecystitis)  Procedures: [id number] - OFFICE OUTPATIENT NEW 30 MINUTES    Plan:  Scheduled for laparoscopic cholecystectomy on 03/03/15.   Patient Education:Alternative treatments to surgery were discussed with patient (and family).  Risks and benefits  of procedure incluidng bleeding, infection, hepatobiliary injury, and the possibllity of an open procedure were fully explained to the patient (and family) who gave informed consent. Patient/family questions were addressed.  Follow-up:Pending Surgery

## 2015-02-27 NOTE — Patient Instructions (Signed)
Jocelyn Richard  02/27/2015     @   Your procedure is scheduled on 03/03/2015  Report to [facility] at 9:45 A.M.  Call this number if you have problems the morning of surgery:  [phone]   Remember:  Do not eat food or drink liquids after midnight.  Take these medicines the morning of surgery with A SIP OF WATER Hydrocodone, Protonix, (Albuterol inhaler and bring with you)   Do not wear jewelry, make-up or nail polish.  Do not wear lotions, powders, or perfumes.  You may wear deodorant.  Do not shave 48 hours prior to surgery.  Men may shave face and neck.  Do not bring valuables to the hospital.  [facility] is not responsible for any belongings or valuables.  Contacts, dentures or bridgework may not be worn into surgery.  Leave your suitcase in the car.  After surgery it may be brought to your room.  For patients admitted to the hospital, discharge time will be determined by your treatment team.  Patients discharged the day of surgery will not be allowed to drive home.    Please read over the following fact sheets that you were given. Surgical Site Infection Prevention and Anesthesia Post-op Instructions     PATIENT INSTRUCTIONS POST-ANESTHESIA  IMMEDIATELY FOLLOWING SURGERY:  Do not drive or operate machinery for the first twenty four hours after surgery.  Do not make any important decisions for twenty four hours after surgery or while taking narcotic pain medications or sedatives.  If you develop intractable nausea and vomiting or a severe headache please notify your doctor immediately.  FOLLOW-UP:  Please make an appointment with your surgeon as instructed. You do not need to follow up with anesthesia unless specifically instructed to do so.  WOUND CARE INSTRUCTIONS (if applicable):  Keep a dry clean dressing on the anesthesia/puncture wound site if there is drainage.  Once the wound has quit draining you may leave it open to air.  Generally you  should leave the bandage intact for twenty four hours unless there is drainage.  If the epidural site drains for more than 36-48 hours please call the anesthesia department.  QUESTIONS?:  Please feel free to call your physician or the hospital operator if you have any questions, and they will be happy to assist you.      Laparoscopic Cholecystectomy Laparoscopic cholecystectomy is surgery to remove the gallbladder. The gallbladder is located in the upper right part of the abdomen, behind the liver. It is a storage sac for bile produced in the liver. Bile aids in the digestion and absorption of fats. Cholecystectomy is often done for inflammation of the gallbladder (cholecystitis). This condition is usually caused by a buildup of gallstones (cholelithiasis) in your gallbladder. Gallstones can block the flow of bile, resulting in inflammation and pain. In severe cases, emergency surgery may be required. When emergency surgery is not required, you will have time to prepare for the procedure. Laparoscopic surgery is an alternative to open surgery. Laparoscopic surgery has a shorter recovery time. Your common bile duct may also need to be examined during the procedure. If stones are found in the common bile duct, they may be removed. LET [facility] CARE PROVIDER KNOW ABOUT:  Any allergies you have.  All medicines you are taking, including vitamins, herbs, eye drops, creams, and over-the-counter medicines.  Previous problems you or members of your family have had with the use of anesthetics.  Any blood disorders you have.  Previous surgeries you have had.  Medical conditions you have. RISKS AND COMPLICATIONS Generally, this is a safe procedure. However, as with any procedure, complications can occur. Possible complications include:  Infection.  Damage to the common bile duct, nerves, arteries, veins, or other internal organs such as the stomach, liver, or intestines.  Bleeding.  A stone may  remain in the common bile duct.  A bile leak from the cyst duct that is clipped when your gallbladder is removed.  The need to convert to open surgery, which requires a larger incision in the abdomen. This may be necessary if your surgeon thinks it is not safe to continue with a laparoscopic procedure. BEFORE THE PROCEDURE  Ask your health care provider about changing or stopping any regular medicines. You will need to stop taking aspirin or blood thinners at least 5 days prior to surgery.  Do not eat or drink anything after midnight the night before surgery.  Let your health care provider know if you develop a cold or other infectious problem before surgery. PROCEDURE   You will be given medicine to make you sleep through the procedure (general anesthetic). A breathing tube will be placed in your mouth.  When you are asleep, your surgeon will make several small cuts (incisions) in your abdomen.  A thin, lighted tube with a tiny camera on the end (laparoscope) is inserted through one of the small incisions. The camera on the laparoscope sends a picture to a TV screen in the operating room. This gives the surgeon a good view inside your abdomen.  A gas will be pumped into your abdomen. This expands your abdomen so that the surgeon has more room to perform the surgery.  Other tools needed for the procedure are inserted through the other incisions. The gallbladder is removed through one of the incisions.  After the removal of your gallbladder, the incisions will be closed with stitches, staples, or skin glue. AFTER THE PROCEDURE  You will be taken to a recovery area where your progress will be checked often.  You may be allowed to go home the same day if your pain is controlled and you can tolerate liquids. Document Released: 08/02/2005 Document Revised: 05/23/2013 Document Reviewed: 03/14/2013 [facility] Patient Information 2015 [employer] [place]. This information is not intended to replace  advice given to you by your health care provider. Make sure you discuss any questions you have with your health care provider.

## 2015-03-03 ENCOUNTER — Ambulatory Visit (HOSPITAL_COMMUNITY): Payer: 59 | Admitting: Anesthesiology

## 2015-03-03 ENCOUNTER — Encounter (HOSPITAL_COMMUNITY): Payer: Self-pay | Admitting: *Deleted

## 2015-03-03 ENCOUNTER — Ambulatory Visit (HOSPITAL_COMMUNITY)
Admission: RE | Admit: 2015-03-03 | Discharge: 2015-03-03 | Disposition: A | Discharge status: Home / Self Care | Payer: 59 | Source: Ambulatory Visit | Attending: General Surgery | Admitting: General Surgery

## 2015-03-03 ENCOUNTER — Encounter (HOSPITAL_COMMUNITY)
Admission: RE | Disposition: A | Discharge status: Home / Self Care | Payer: Self-pay | Source: Ambulatory Visit | Attending: General Surgery

## 2015-03-03 DIAGNOSIS — K219 Gastro-esophageal reflux disease without esophagitis: Secondary | ICD-10-CM | POA: Diagnosis not present

## 2015-03-03 DIAGNOSIS — Z79891 Long term (current) use of opiate analgesic: Secondary | ICD-10-CM | POA: Diagnosis not present

## 2015-03-03 DIAGNOSIS — I10 Essential (primary) hypertension: Secondary | ICD-10-CM | POA: Insufficient documentation

## 2015-03-03 DIAGNOSIS — F17210 Nicotine dependence, cigarettes, uncomplicated: Secondary | ICD-10-CM | POA: Diagnosis not present

## 2015-03-03 DIAGNOSIS — Z79899 Other long term (current) drug therapy: Secondary | ICD-10-CM | POA: Diagnosis not present

## 2015-03-03 DIAGNOSIS — K811 Chronic cholecystitis: Secondary | ICD-10-CM | POA: Diagnosis present

## 2015-03-03 HISTORY — PX: CHOLECYSTECTOMY: SHX55

## 2015-03-03 SURGERY — LAPAROSCOPIC CHOLECYSTECTOMY
Anesthesia: General | Site: Abdomen

## 2015-03-03 MED ORDER — FENTANYL CITRATE (PF) 250 MCG/5ML IJ SOLN
INTRAMUSCULAR | Status: AC
  Filled 2015-03-03: qty 5

## 2015-03-03 MED ORDER — POVIDONE-IODINE 10 % EX OINT
TOPICAL_OINTMENT | CUTANEOUS | Status: AC
  Filled 2015-03-03: qty 1

## 2015-03-03 MED ORDER — ONDANSETRON HCL 4 MG/2ML IJ SOLN
4.0000 mg | Freq: Once | INTRAMUSCULAR | Status: AC | PRN
Start: 2015-03-03 — End: 2015-03-03
  Administered 2015-03-03: 4 mg via INTRAVENOUS

## 2015-03-03 MED ORDER — LIDOCAINE HCL (CARDIAC) 20 MG/ML IV SOLN
INTRAVENOUS | Status: DC | PRN
  Administered 2015-03-03: 50 mg via INTRAVENOUS

## 2015-03-03 MED ORDER — FENTANYL CITRATE (PF) 100 MCG/2ML IJ SOLN
INTRAMUSCULAR | Status: DC | PRN
  Administered 2015-03-03 (×5): 50 ug via INTRAVENOUS

## 2015-03-03 MED ORDER — GLYCOPYRROLATE 0.2 MG/ML IJ SOLN
INTRAMUSCULAR | Status: AC
  Filled 2015-03-03: qty 1

## 2015-03-03 MED ORDER — SODIUM CHLORIDE 0.9 % IR SOLN
Status: DC | PRN
  Administered 2015-03-03: 1000 mL

## 2015-03-03 MED ORDER — MIDAZOLAM HCL 2 MG/2ML IJ SOLN
1.0000 mg | INTRAMUSCULAR | Status: DC | PRN
Start: 2015-03-03 — End: 2015-03-03
  Administered 2015-03-03 (×2): 2 mg via INTRAVENOUS
  Filled 2015-03-03: qty 2

## 2015-03-03 MED ORDER — KETOROLAC TROMETHAMINE 30 MG/ML IJ SOLN
INTRAMUSCULAR | Status: AC
  Filled 2015-03-03: qty 1

## 2015-03-03 MED ORDER — KETOROLAC TROMETHAMINE 30 MG/ML IJ SOLN
30.0000 mg | Freq: Once | INTRAMUSCULAR | Status: AC
  Administered 2015-03-03: 30 mg via INTRAVENOUS

## 2015-03-03 MED ORDER — ONDANSETRON HCL 4 MG/2ML IJ SOLN
INTRAMUSCULAR | Status: AC
  Filled 2015-03-03: qty 2

## 2015-03-03 MED ORDER — BUPIVACAINE HCL (PF) 0.5 % IJ SOLN
INTRAMUSCULAR | Status: DC | PRN
  Administered 2015-03-03: 10 mL

## 2015-03-03 MED ORDER — NEOSTIGMINE METHYLSULFATE 10 MG/10ML IV SOLN
INTRAVENOUS | Status: DC | PRN
  Administered 2015-03-03: 4 mg via INTRAVENOUS

## 2015-03-03 MED ORDER — ROCURONIUM BROMIDE 100 MG/10ML IV SOLN
INTRAVENOUS | Status: DC | PRN
  Administered 2015-03-03: 15 mg via INTRAVENOUS
  Administered 2015-03-03: 10 mg via INTRAVENOUS

## 2015-03-03 MED ORDER — CIPROFLOXACIN IN D5W 400 MG/200ML IV SOLN
400.0000 mg | INTRAVENOUS | Status: AC
  Administered 2015-03-03: 400 mg via INTRAVENOUS
  Filled 2015-03-03: qty 200

## 2015-03-03 MED ORDER — GLYCOPYRROLATE 0.2 MG/ML IJ SOLN
0.2000 mg | Freq: Once | INTRAMUSCULAR | Status: AC
  Administered 2015-03-03: 0.2 mg via INTRAVENOUS

## 2015-03-03 MED ORDER — ROCURONIUM BROMIDE 50 MG/5ML IV SOLN
INTRAVENOUS | Status: AC
  Filled 2015-03-03: qty 1

## 2015-03-03 MED ORDER — PROPOFOL 10 MG/ML IV BOLUS
INTRAVENOUS | Status: DC | PRN
  Administered 2015-03-03: 150 mg via INTRAVENOUS

## 2015-03-03 MED ORDER — SUCCINYLCHOLINE CHLORIDE 20 MG/ML IJ SOLN
INTRAMUSCULAR | Status: DC | PRN
  Administered 2015-03-03: 120 mg via INTRAVENOUS

## 2015-03-03 MED ORDER — PROPOFOL 10 MG/ML IV BOLUS
INTRAVENOUS | Status: AC
  Filled 2015-03-03: qty 20

## 2015-03-03 MED ORDER — FENTANYL CITRATE (PF) 100 MCG/2ML IJ SOLN
25.0000 ug | INTRAMUSCULAR | Status: AC
  Administered 2015-03-03 (×2): 25 ug via INTRAVENOUS

## 2015-03-03 MED ORDER — CHLORHEXIDINE GLUCONATE 4 % EX LIQD: 1.0000 "application " | Freq: Once | CUTANEOUS | Status: DC

## 2015-03-03 MED ORDER — LACTATED RINGERS IV SOLN
INTRAVENOUS | Status: DC
  Administered 2015-03-03 (×2): via INTRAVENOUS

## 2015-03-03 MED ORDER — BUPIVACAINE HCL (PF) 0.5 % IJ SOLN
INTRAMUSCULAR | Status: AC
  Filled 2015-03-03: qty 30

## 2015-03-03 MED ORDER — LIDOCAINE HCL (PF) 1 % IJ SOLN
INTRAMUSCULAR | Status: AC
  Filled 2015-03-03: qty 5

## 2015-03-03 MED ORDER — FENTANYL CITRATE (PF) 100 MCG/2ML IJ SOLN
INTRAMUSCULAR | Status: AC
  Filled 2015-03-03: qty 2

## 2015-03-03 MED ORDER — HEMOSTATIC AGENTS (NO CHARGE) OPTIME
TOPICAL | Status: DC | PRN
  Administered 2015-03-03: 1

## 2015-03-03 MED ORDER — GLYCOPYRROLATE 0.2 MG/ML IJ SOLN
INTRAMUSCULAR | Status: DC | PRN
  Administered 2015-03-03: 0.6 mg via INTRAVENOUS

## 2015-03-03 MED ORDER — FENTANYL CITRATE (PF) 250 MCG/5ML IJ SOLN
INTRAMUSCULAR | Status: AC
Start: 2015-03-03 — End: 2015-03-03
  Filled 2015-03-03: qty 5

## 2015-03-03 MED ORDER — MIDAZOLAM HCL 2 MG/2ML IJ SOLN
INTRAMUSCULAR | Status: AC
  Filled 2015-03-03: qty 2

## 2015-03-03 MED ORDER — OXYCODONE-ACETAMINOPHEN 7.5-325 MG PO TABS: 1.0000 | ORAL_TABLET | ORAL | Status: DC | PRN

## 2015-03-03 MED ORDER — ONDANSETRON HCL 4 MG/2ML IJ SOLN
4.0000 mg | Freq: Once | INTRAMUSCULAR | Status: AC
  Administered 2015-03-03: 4 mg via INTRAVENOUS

## 2015-03-03 MED ORDER — POVIDONE-IODINE 10 % OINT PACKET
TOPICAL_OINTMENT | CUTANEOUS | Status: DC | PRN
  Administered 2015-03-03: 1 via TOPICAL

## 2015-03-03 MED ORDER — FENTANYL CITRATE (PF) 100 MCG/2ML IJ SOLN
25.0000 ug | INTRAMUSCULAR | Status: DC | PRN
  Administered 2015-03-03 (×2): 50 ug via INTRAVENOUS

## 2015-03-03 MED ORDER — SUCCINYLCHOLINE CHLORIDE 20 MG/ML IJ SOLN
INTRAMUSCULAR | Status: AC
  Filled 2015-03-03: qty 1

## 2015-03-03 SURGICAL SUPPLY — 46 items
APPLIER CLIP LAPSCP 10X32 DD (CLIP) ×3 IMPLANT
BAG HAMPER (MISCELLANEOUS) ×3 IMPLANT
BAG SPEC RTRVL LRG 6X4 10 (ENDOMECHANICALS) ×1
CHLORAPREP W/TINT 26ML (MISCELLANEOUS) ×3 IMPLANT
CLOTH BEACON ORANGE TIMEOUT ST (SAFETY) ×3 IMPLANT
COVER LIGHT HANDLE STERIS (MISCELLANEOUS) ×6 IMPLANT
DECANTER SPIKE VIAL GLASS SM (MISCELLANEOUS) ×3 IMPLANT
ELECT REM PT RETURN 9FT ADLT (ELECTROSURGICAL) ×3
ELECTRODE REM PT RTRN 9FT ADLT (ELECTROSURGICAL) ×1 IMPLANT
FILTER SMOKE EVAC LAPAROSHD (FILTER) ×3 IMPLANT
FORMALIN 10 PREFIL 120ML (MISCELLANEOUS) ×3 IMPLANT
GLOVE BIOGEL PI IND STRL 6.5 (GLOVE) IMPLANT
GLOVE BIOGEL PI IND STRL 7.0 (GLOVE) IMPLANT
GLOVE BIOGEL PI INDICATOR 6.5 (GLOVE) ×4
GLOVE BIOGEL PI INDICATOR 7.0 (GLOVE) ×4
GLOVE ECLIPSE 7.0 STRL STRAW (GLOVE) ×2 IMPLANT
GLOVE SURG SS PI 7.0 STRL IVOR (GLOVE) ×2 IMPLANT
GLOVE SURG SS PI 7.5 STRL IVOR (GLOVE) ×3 IMPLANT
GOWN STRL REUS W/ TWL XL LVL3 (GOWN DISPOSABLE) ×1 IMPLANT
GOWN STRL REUS W/TWL LRG LVL3 (GOWN DISPOSABLE) ×6 IMPLANT
GOWN STRL REUS W/TWL XL LVL3 (GOWN DISPOSABLE) ×3
HEMOSTAT SNOW SURGICEL 2X4 (HEMOSTASIS) ×3 IMPLANT
INST SET LAPROSCOPIC AP (KITS) ×3 IMPLANT
IV NS IRRIG 3000ML ARTHROMATIC (IV SOLUTION) IMPLANT
KIT ROOM TURNOVER APOR (KITS) ×3 IMPLANT
MANIFOLD NEPTUNE II (INSTRUMENTS) ×3 IMPLANT
NDL INSUFFLATION 14GA 120MM (NEEDLE) ×1 IMPLANT
NEEDLE INSUFFLATION 14GA 120MM (NEEDLE) ×3 IMPLANT
NS IRRIG 1000ML POUR BTL (IV SOLUTION) ×3 IMPLANT
PACK LAP CHOLE LZT030E (CUSTOM PROCEDURE TRAY) ×3 IMPLANT
PAD ARMBOARD 7.5X6 YLW CONV (MISCELLANEOUS) ×3 IMPLANT
POUCH SPECIMEN RETRIEVAL 10MM (ENDOMECHANICALS) ×3 IMPLANT
SET BASIN LINEN APH (SET/KITS/TRAYS/PACK) ×3 IMPLANT
SET TUBE IRRIG SUCTION NO TIP (IRRIGATION / IRRIGATOR) IMPLANT
SLEEVE ENDOPATH XCEL 5M (ENDOMECHANICALS) ×3 IMPLANT
SPONGE GAUZE 2X2 8PLY STER LF (GAUZE/BANDAGES/DRESSINGS) ×1
SPONGE GAUZE 2X2 8PLY STRL LF (GAUZE/BANDAGES/DRESSINGS) ×5 IMPLANT
STAPLER VISISTAT (STAPLE) ×3 IMPLANT
SUT VICRYL 0 UR6 27IN ABS (SUTURE) ×3 IMPLANT
TAPE CLOTH SURG 4X10 WHT LF (GAUZE/BANDAGES/DRESSINGS) ×2 IMPLANT
TROCAR ENDO BLADELESS 11MM (ENDOMECHANICALS) ×3 IMPLANT
TROCAR XCEL NON-BLD 5MMX100MML (ENDOMECHANICALS) ×3 IMPLANT
TROCAR XCEL UNIV SLVE 11M 100M (ENDOMECHANICALS) ×3 IMPLANT
TUBING INSUFFLATION (TUBING) ×3 IMPLANT
WARMER LAPAROSCOPE (MISCELLANEOUS) ×3 IMPLANT
YANKAUER SUCT 12FT TUBE ARGYLE (SUCTIONS) ×3 IMPLANT

## 2015-03-03 NOTE — Transfer of Care (Signed)
Immediate Anesthesia Transfer of Care Note  Patient: Jocelyn Richard  Procedure(s) Performed: Procedure(s): LAPAROSCOPIC CHOLECYSTECTOMY (N/A)  Patient Location: PACU  Anesthesia Type:General  Level of Consciousness: awake, oriented and patient cooperative  Airway & Oxygen Therapy: Patient Spontanous Breathing and Patient connected to face mask oxygen  Post-op Assessment: Report given to RN and Post -op Vital signs reviewed and stable  Post vital signs: Reviewed and stable  Last Vitals:  Filed Vitals:   03/03/15 1100  BP: 103/63  Temp:   Resp: 12    Complications: No apparent anesthesia complications

## 2015-03-03 NOTE — Anesthesia Procedure Notes (Signed)
Procedure Name: Intubation Date/Time: 03/03/2015 11:14 AM Performed by: Pernell DupreADAMS, Jocelyn Carchi A Pre-anesthesia Checklist: Patient identified, Timeout performed, Emergency Drugs available, Patient being monitored and Suction available Patient Re-evaluated:Patient Re-evaluated prior to inductionOxygen Delivery Method: Circle System Utilized Preoxygenation: Pre-oxygenation with 100% oxygen Intubation Type: IV induction, Rapid sequence and Cricoid Pressure applied Ventilation: Mask ventilation without difficulty Laryngoscope Size: 3 and Miller Grade View: Grade I Tube type: Oral Tube size: 7.0 mm Number of attempts: 1 Airway Equipment and Method: Stylet Placement Confirmation: ETT inserted through vocal cords under direct vision,  positive ETCO2 and breath sounds checked- equal and bilateral Secured at: 21 cm Tube secured with: Tape Dental Injury: Teeth and Oropharynx as per pre-operative assessment

## 2015-03-03 NOTE — Anesthesia Postprocedure Evaluation (Signed)
  Anesthesia Post-op Note  Patient: Jocelyn Richard  Procedure(s) Performed: Procedure(s): LAPAROSCOPIC CHOLECYSTECTOMY (N/A)  Patient Location: PACU  Anesthesia Type:General  Level of Consciousness: awake, alert , oriented and patient cooperative  Airway and Oxygen Therapy: Patient Spontanous Breathing  Post-op Pain: none  Post-op Assessment: Post-op Vital signs reviewed, Patient's Cardiovascular Status Stable, Respiratory Function Stable and Pain level controlled              Post-op Vital Signs: Reviewed and stable  Last Vitals:  Filed Vitals:   03/03/15 1248  BP: 128/87  Pulse: 70  Temp:   Resp: 13    Complications: No apparent anesthesia complications

## 2015-03-03 NOTE — Anesthesia Preprocedure Evaluation (Signed)
Anesthesia Evaluation  Patient identified by MRN, date of birth, ID band Patient awake    Reviewed: Allergy & Precautions, NPO status , Patient's Chart, lab work & pertinent test results  Airway Mallampati: II  TM Distance: >3 FB     Dental  (+) Teeth Intact, Dental Advisory Given,    Pulmonary Current Smoker (am cough),  breath sounds clear to auscultation        Cardiovascular hypertension, Pt. on medications Rhythm:Regular Rate:Normal     Neuro/Psych    GI/Hepatic PUD, GERD-  Poorly Controlled,  Endo/Other    Renal/GU      Musculoskeletal   Abdominal   Peds  Hematology   Anesthesia Other Findings   Reproductive/Obstetrics                             Anesthesia Physical Anesthesia Plan  ASA: II  Anesthesia Plan: General   Post-op Pain Management:    Induction: Intravenous, Rapid sequence and Cricoid pressure planned  Airway Management Planned: Oral ETT  Additional Equipment:   Intra-op Plan:   Post-operative Plan: Extubation in OR  Informed Consent: I have reviewed the patients History and Physical, chart, labs and discussed the procedure including the risks, benefits and alternatives for the proposed anesthesia with the patient or authorized representative who has indicated his/her understanding and acceptance.     Plan Discussed with:   Anesthesia Plan Comments:         Anesthesia Quick Evaluation

## 2015-03-03 NOTE — Discharge Instructions (Signed)

## 2015-03-03 NOTE — Interval H&P Note (Signed)
History and Physical Interval Note:  03/03/2015 10:41 AM  Jocelyn Richard  has presented today for surgery, with the diagnosis of chronic cholecystitis  The various methods of treatment have been discussed with the patient and family. After consideration of risks, benefits and other options for treatment, the patient has consented to  Procedure(s): LAPAROSCOPIC CHOLECYSTECTOMY (N/A) as a surgical intervention .  The patient's history has been reviewed, patient examined, no change in status, stable for surgery.  I have reviewed the patient's chart and labs.  Questions were answered to the patient's satisfaction.     Franky MachoJENKINS,Ami Mally A

## 2015-03-03 NOTE — Op Note (Signed)
Patient:  Jocelyn Richard  DOB:  1966-08-10  MRN:  [id number]   Preop Diagnosis:  Chronic cholecystitis  Postop Diagnosis:  Same  Procedure:  Laparoscopic cholecystectomy  Surgeon:  Franky MachoMark Eastin Swing, M.D.  Anes:  Gen. endotracheal  Indications:  Patient is a 49 year old female who presents with chronic cholecystitis. The risks and benefits of the procedure including bleeding, infection, hepatobiliary injury, and the possibility of an open procedure were fully explained to the patient, who gave informed consent.  Procedure note:  Patient was placed the supine position. After induction of general endotracheal anesthesia, the abdomen was prepped and draped using the usual sterile technique with DuraPrep. Surgical site confirmation was performed.  A supraumbilical incision was made down to the fascia. A Veress needle was introduced into the abdominal cavity and confirmation of placement was done using the saline drop test. The abdomen was then insufflated to 16 mmHg pressure. An 11 mm trocar was then introduced under direct visualization into the abdominal cavity without difficulty. The patient was placed in reverse Trendelenburg position and an additional 11 mm trocar was placed in the epigastric region and 5 mm trochars were placed the right upper quadrant and right flank regions. Liver was inspected and noted to be within normal limits. The gallbladder was retracted in a dynamic fashion in order to expose the triangle of Calot. The cystic duct was first identified. Its junction to the infundibulum was fully identified. Endoclips were placed proximally distally on the cystic duct, and the cystic duct was divided. This was likewise done to the cystic artery. The gallbladder was freed away from the gallbladder fossa using Bovie electrocautery. The gallbladder delivered through the epigastric trocar site using an Endo Catch bag. The gallbladder fossa was inspected and no abnormal bleeding or bile leakage  was noted. Surgicel is placed the gallbladder fossa. All fluid and air were then evacuated from the abdominal cavity prior to removal of the trochars.  All wounds were irrigated with normal saline. All wounds were injected with 0.5% Sensorcaine. The supraumbilical fascia as well as epigastric fascia were reapproximated using 0 Vicryl interrupted sutures. All skin incisions were closed using staples. Betadine ointment and dry sterile dressings were applied.  All tape and needle counts were correct at the end of the procedure. Patient was extubated in the operating room and transferred to PACU in stable condition.    Complications:  None  EBL:  Minimal  Specimen:  Gallbladder

## 2015-03-04 ENCOUNTER — Encounter (HOSPITAL_COMMUNITY): Payer: Self-pay | Admitting: General Surgery

## 2016-01-01 ENCOUNTER — Encounter: Payer: Self-pay | Admitting: Internal Medicine

## 2016-01-15 DIAGNOSIS — I2510 Atherosclerotic heart disease of native coronary artery without angina pectoris: Secondary | ICD-10-CM

## 2016-01-15 DIAGNOSIS — Z9861 Coronary angioplasty status: Secondary | ICD-10-CM

## 2016-01-22 ENCOUNTER — Encounter: Payer: Self-pay | Admitting: Gastroenterology

## 2016-01-22 ENCOUNTER — Encounter (HOSPITAL_COMMUNITY): Payer: Self-pay

## 2016-01-22 ENCOUNTER — Other Ambulatory Visit: Payer: Self-pay

## 2016-01-22 ENCOUNTER — Encounter (HOSPITAL_COMMUNITY)
Admission: RE | Admit: 2016-01-22 | Discharge: 2016-01-22 | Disposition: A | Discharge status: Home / Self Care | Payer: BLUE CROSS/BLUE SHIELD | Source: Ambulatory Visit | Attending: Internal Medicine | Admitting: Internal Medicine

## 2016-01-22 ENCOUNTER — Ambulatory Visit (INDEPENDENT_AMBULATORY_CARE_PROVIDER_SITE_OTHER): Payer: BLUE CROSS/BLUE SHIELD | Admitting: Gastroenterology

## 2016-01-22 VITALS — BP 119/81 | HR 91 | Temp 97.3°F | Ht 63.0 in | Wt 152.2 lb

## 2016-01-22 DIAGNOSIS — K529 Noninfective gastroenteritis and colitis, unspecified: Secondary | ICD-10-CM | POA: Insufficient documentation

## 2016-01-22 DIAGNOSIS — Z79899 Other long term (current) drug therapy: Secondary | ICD-10-CM | POA: Insufficient documentation

## 2016-01-22 DIAGNOSIS — R9431 Abnormal electrocardiogram [ECG] [EKG]: Secondary | ICD-10-CM | POA: Insufficient documentation

## 2016-01-22 DIAGNOSIS — K2100 Gastro-esophageal reflux disease with esophagitis, without bleeding: Secondary | ICD-10-CM

## 2016-01-22 DIAGNOSIS — Z01812 Encounter for preprocedural laboratory examination: Secondary | ICD-10-CM | POA: Insufficient documentation

## 2016-01-22 DIAGNOSIS — Z9049 Acquired absence of other specified parts of digestive tract: Secondary | ICD-10-CM | POA: Diagnosis not present

## 2016-01-22 DIAGNOSIS — Z01810 Encounter for preprocedural cardiovascular examination: Secondary | ICD-10-CM | POA: Diagnosis not present

## 2016-01-22 DIAGNOSIS — K219 Gastro-esophageal reflux disease without esophagitis: Secondary | ICD-10-CM | POA: Diagnosis not present

## 2016-01-22 DIAGNOSIS — Z90710 Acquired absence of both cervix and uterus: Secondary | ICD-10-CM | POA: Diagnosis not present

## 2016-01-22 DIAGNOSIS — F17210 Nicotine dependence, cigarettes, uncomplicated: Secondary | ICD-10-CM | POA: Insufficient documentation

## 2016-01-22 DIAGNOSIS — R109 Unspecified abdominal pain: Secondary | ICD-10-CM | POA: Insufficient documentation

## 2016-01-22 DIAGNOSIS — Z9889 Other specified postprocedural states: Secondary | ICD-10-CM | POA: Diagnosis not present

## 2016-01-22 HISTORY — DX: Gastro-esophageal reflux disease without esophagitis: K21.9

## 2016-01-22 LAB — BASIC METABOLIC PANEL WITH GFR
Anion gap: 7 (ref 5–15)
BUN: 11 mg/dL (ref 6–20)
CO2: 25 mmol/L (ref 22–32)
Calcium: 9.5 mg/dL (ref 8.9–10.3)
Chloride: 104 mmol/L (ref 101–111)
Creatinine, Ser: 0.64 mg/dL (ref 0.44–1.00)
GFR calc Af Amer: >60 mL/min
GFR calc non Af Amer: >60 mL/min
Glucose, Bld: 83 mg/dL (ref 65–99)
Potassium: 3.5 mmol/L (ref 3.5–5.1)
Sodium: 136 mmol/L (ref 135–145)

## 2016-01-22 LAB — CBC
HCT: 44.9 % (ref 36.0–46.0)
Hemoglobin: 15.3 g/dL — ABNORMAL HIGH (ref 12.0–15.0)
MCH: 31.7 pg (ref 26.0–34.0)
MCHC: 34.1 g/dL (ref 30.0–36.0)
MCV: 93.2 fL (ref 78.0–100.0)
Platelets: 326 K/uL (ref 150–400)
RBC: 4.82 MIL/uL (ref 3.87–5.11)
RDW: 14.1 % (ref 11.5–15.5)
WBC: 12.5 K/uL — ABNORMAL HIGH (ref 4.0–10.5)

## 2016-01-22 MED ORDER — PEG 3350-KCL-NA BICARB-NACL 420 G PO SOLR: 4000.0000 mL | ORAL | Status: DC

## 2016-01-22 MED ORDER — DICYCLOMINE HCL 10 MG PO CAPS: 10.0000 mg | ORAL_CAPSULE | Freq: Three times a day (TID) | ORAL | Status: DC

## 2016-01-22 NOTE — Progress Notes (Signed)
Primary Care Physician:  Lenise Herald, PA-C Primary Gastroenterologist:  Dr. Jena Gauss   Chief Complaint  Patient presents with  . Gastroesophageal Reflux  . Abdominal Pain    HPI:   Jocelyn Richard is a 50 y.o. female presenting today at the request of her PCP secondary to GERD and abdominal pain. She is s/p cholecystectomy in July 2016.   States she hurts from her esophagus all the way down to her upper abdomen. Omeprazole 40 mg a day. Has been on Protonix prior to this. Feels like it may have helped better than omeprazole. Difficulty swallowing at times. Solid food and liquid dysphagia.   [place] had an EGD 2014 : states she had ulcers in her stomach and Barrett's. No surveillance since 2014. WE DO NOT HAVE PROCEDURE NOTES AT TIME OF VISIT.   Has a chronic history of abdominal bloating, chronic diarrhea even prior to cholecystectomy. Stress causes worse discomfort. Upon waking starts having loose stools. Went 15 times yesterday. Some low-volume hematochezia, burgundy stool. No prior colonoscopy. No unexplained weight loss.   Under significant stress, legal guardian of grandchildren. Daughter-in-law just passed away.   Past Medical History  Diagnosis Date  . Hypertension   . Stomach ulcer   . Esophagus, Barrett's   . Anxiety   . GERD (gastroesophageal reflux disease)     Past Surgical History  Procedure Laterality Date  . Abdominal hysterectomy    . Cholecystectomy N/A 03/03/2015    Procedure: LAPAROSCOPIC CHOLECYSTECTOMY;  Surgeon: Franky Macho, MD;  Location: [facility];  Service: General;  Laterality: N/A;  . Hernia repair      umbilical    Current Outpatient Prescriptions  Medication Sig Dispense Refill  . diazepam (VALIUM) 5 MG tablet TK 1 T PO QHS  5  . escitalopram (LEXAPRO) 10 MG tablet TK 1 T PO D  11  . omeprazole (PRILOSEC) 20 MG capsule Take 40 mg by mouth daily.     Marland Kitchen triamterene-hydrochlorothiazide (DYAZIDE) 37.5-25 MG capsule TK 1 C PO D  11  . dicyclomine  (BENTYL) 10 MG capsule Take 1 capsule (10 mg total) by mouth 4 (four) times daily -  before meals and at bedtime. 120 capsule 3  . polyethylene glycol-electrolytes (TRILYTE) 420 g solution Take 4,000 mLs by mouth as directed. 4000 mL 0  . [DISCONTINUED] famotidine (PEPCID) 20 MG tablet Take 1 tablet (20 mg total) by mouth 2 (two) times daily. (Patient not taking: Reported on 12/13/2014) 30 tablet 0  . [DISCONTINUED] sucralfate (CARAFATE) 1 G tablet Take 1 tablet (1 g total) by mouth 4 (four) times daily -  with meals and at bedtime. (Patient not taking: Reported on 12/13/2014) 30 tablet 1   No current facility-administered medications for this visit.    Allergies as of 01/22/2016  . (No Known Allergies)    Family History  Problem Relation Age of Onset  . Colon cancer Neg Hx     Social History   Social History  . Marital Status: Divorced    Spouse Name: N/A  . Number of Children: N/A  . Years of Education: N/A   Occupational History  . Not on file.   Social History Main Topics  . Smoking status: Current Every Day Smoker -- 1.00 packs/day for 16 years    Types: Cigarettes  . Smokeless tobacco: Not on file  . Alcohol Use: Yes     Comment: occ  . Drug Use: No  . Sexual Activity: Yes    Birth  Control/ Protection: Surgical   Other Topics Concern  . Not on file   Social History Narrative    Review of Systems: As mentioned in HPI.   Physical Exam: BP 119/81 mmHg  Pulse 91  Temp(Src) 97.3 F (36.3 C)  Ht 5\' 3"  (1.6 m)  Wt 152 lb 3.2 oz (69.037 kg)  BMI 26.97 kg/m2 General:   Alert and oriented. Pleasant and cooperative. Well-nourished and well-developed.  Head:  Normocephalic and atraumatic. Eyes:  Without icterus, sclera clear and conjunctiva pink.  Ears:  Normal auditory acuity. Nose:  No deformity, discharge,  or lesions. Mouth:  No deformity or lesions, oral mucosa pink. Lungs:  Clear to auscultation bilaterally. No wheezes, rales, or rhonchi. No distress.    Heart:  S1, S2 present without murmurs appreciated.  Abdomen:  +BS, soft, non-tender and non-distended. No HSM noted. No guarding or rebound. No masses appreciated.  Rectal:  Deferred  Msk:  Symmetrical without gross deformities. Normal posture Extremities:  Without edema. Neurologic:  Alert and  oriented x4;  grossly normal neurologically. Skin:  Intact without significant lesions or rashes. Psych:  Alert and cooperative. Normal mood and affect.

## 2016-01-22 NOTE — Patient Instructions (Signed)
We are out of Nexium samples. I would like for you to continue Prilosec for now. If we get Dexilant samples, I will let you know.   Please have blood work done today.  I have sent in Bentyl to take with meals and at bedtime (4 times a day). This is for loose stool and cramping.  We have scheduled you for a colonoscopy, upper endoscopy, and dilation as appropriate with Dr. Jena Gaussourk in the near future.  I will see you in the office after the procedure.

## 2016-01-22 NOTE — Pre-Procedure Instructions (Signed)
Patient given information to sign up for my chart at home. 

## 2016-01-22 NOTE — Patient Instructions (Signed)
Jocelyn Richard  01/22/2016     @PREFPERIOPPHARMACY @   Your procedure is scheduled on  01/26/2016   [facility] at  [facility].  Call this number if you have problems the morning of surgery:  [phone]   Remember:  Do not eat food or drink liquids after midnight.  Take these medicines the morning of surgery with A SIP OF WATER  Valium, lexapro, prilosec.   Do not wear jewelry, make-up or nail polish.  Do not wear lotions, powders, or perfumes.  You may wear deodorant.  Do not shave 48 hours prior to surgery.  Men may shave face and neck.  Do not bring valuables to the hospital.  [facility] not [facility] for any belongings or valuables.  Contacts, dentures or bridgework may not be worn into surgery.  Leave your suitcase in the car.  After surgery it may be brought to your room.  For patients admitted to the hospital, discharge time will be determined by your treatment team.  Patients discharged the day of surgery will not be allowed to drive home.   Name and phone number of your driver:   family Special instructions:  Follow the diet and prep instructions to you by Dr Luvenia Starchourk's office.  Please read over the following fact sheets that you were given. Pain Booklet, Surgical Site Infection Prevention, Anesthesia Post-op Instructions and Care and Recovery After Surgery      Esophagogastroduodenoscopy Esophagogastroduodenoscopy (EGD) is a procedure that is used to examine the lining of the esophagus, stomach, and first part of the small intestine (duodenum). A long, flexible, lighted tube with a camera attached (endoscope) is inserted down the throat to view these organs. This procedure is done to detect problems or abnormalities, such as inflammation, bleeding, ulcers, or growths, in order to treat them. The procedure lasts 5-20 minutes. It is usually an outpatient procedure, but it may need to be performed in a hospital in emergency cases. LET  [facility] KNOW ABOUT:  Any allergies you have.  All medicines you are taking, including vitamins, herbs, eye drops, creams, and over-the-counter medicines.  Previous problems you or members of your family have had with the use of anesthetics.  Any blood disorders you have.  Previous surgeries you have had.  Medical conditions you have. RISKS AND COMPLICATIONS Generally, this is a safe procedure. However, problems can occur and include:  Infection.  Bleeding.  Tearing (perforation) of the esophagus, stomach, or duodenum.  Difficulty breathing or not being able to breathe.  Excessive sweating.  Spasms of the larynx.  Slowed heartbeat.  Low blood pressure. BEFORE THE PROCEDURE  Do not eat or drink anything after midnight on the night before the procedure or as directed by your health care provider.  Do not take your regular medicines before the procedure if your health care provider asks you not to. Ask your health care provider about changing or stopping those medicines.  If you wear dentures, be prepared to remove them before the procedure.  Arrange for someone to drive you home after the procedure. PROCEDURE  A numbing medicine (local anesthetic) may be sprayed in your throat for comfort and to stop you from gagging or coughing.  You will have an IV tube inserted in a vein in your hand or arm. You will receive medicines and fluids through this tube.  You will be given a medicine  to relax you (sedative).  A pain reliever will be given through the IV tube.  A mouth guard may be placed in your mouth to protect your teeth and to keep you from biting on the endoscope.  You will be asked to lie on your left side.  The endoscope will be inserted down your throat and into your esophagus, stomach, and duodenum.  Air will be put through the endoscope to allow your health care provider to clearly view the lining of your esophagus.  The lining of your  esophagus, stomach, and duodenum will be examined. During the exam, your health care provider may:  Remove tissue to be examined under a microscope (biopsy) for inflammation, infection, or other medical problems.  Remove growths.  Remove objects (foreign bodies) that are stuck.  Treat any bleeding with medicines or other devices that stop tissues from bleeding (hot cautery, clipping devices).  Widen (dilate) or stretch narrowed areas of your esophagus and stomach.  The endoscope will be withdrawn. AFTER THE PROCEDURE  You will be taken to a recovery area for observation. Your blood pressure, heart rate, breathing rate, and blood oxygen level will be monitored often until the medicines you were given have worn off.  Do not eat or drink anything until the numbing medicine has worn off and your gag reflex has returned. You may choke.  Your health care provider should be able to discuss his or her findings with you. It will take longer to discuss the test results if any biopsies were taken.   This information is not intended to replace advice given to you by your health care provider. Make sure you discuss any questions you have with your health care provider.   Document Released: 12/03/2004 Document Revised: 08/23/2014 Document Reviewed: 07/05/2012 Elsevier Interactive Patient Education 2016 [employer]. Esophagogastroduodenoscopy, Care After Refer to this sheet in the next few weeks. These instructions provide you with information about caring for yourself after your procedure. Your health care provider may also give you more specific instructions. Your treatment has been planned according to current medical practices, but problems sometimes occur. Call your health care provider if you have any problems or questions after your procedure. WHAT TO EXPECT AFTER THE PROCEDURE After your procedure, it is typical to feel:  Soreness in your throat.  Pain with swallowing.  Sick to your  stomach (nauseous).  Bloated.  Dizzy.  Fatigued. HOME CARE INSTRUCTIONS  Do not eat or drink anything until the numbing medicine (local anesthetic) has worn off and your gag reflex has returned. You will know that the local anesthetic has worn off when you can swallow comfortably.  Do not drive or operate machinery until directed by your health care provider.  Take medicines only as directed by your health care provider. SEEK MEDICAL CARE IF:   You cannot stop coughing.  You are not urinating at all or less than usual. SEEK IMMEDIATE MEDICAL CARE IF:  You have difficulty swallowing.  You cannot eat or drink.  You have worsening throat or chest pain.  You have dizziness or lightheadedness or you faint.  You have nausea or vomiting.  You have chills.  You have a fever.  You have severe abdominal pain.  You have black, tarry, or bloody stools.   This information is not intended to replace advice given to you by your health care provider. Make sure you discuss any questions you have with your health care provider.   Document Released: 07/19/2012 Document Revised: 08/23/2014  Document Reviewed: 07/19/2012 Elsevier Interactive Patient Education 2016 [employer]. Esophageal Dilatation Esophageal dilatation is a procedure to open a blocked or narrowed part of the esophagus. The esophagus is the long tube in your throat that carries food and liquid from your mouth to your stomach. The procedure is also called esophageal dilation.  You may need this procedure if you have a buildup of scar tissue in your esophagus that makes it difficult, painful, or even impossible to swallow. This can be caused by gastroesophageal reflux disease (GERD). In rare cases, people need this procedure because they have cancer of the esophagus or a problem with the way food moves through the esophagus. Sometimes you may need to have another dilatation to enlarge the opening of the esophagus  gradually. LET [facility] CARE PROVIDER KNOW ABOUT:   Any allergies you have.  All medicines you are taking, including vitamins, herbs, eye drops, creams, and over-the-counter medicines.  Previous problems you or members of your family have had with the use of anesthetics.  Any blood disorders you have.  Previous surgeries you have had.  Medical conditions you have.  Any antibiotic medicines you are required to take before dental procedures. RISKS AND COMPLICATIONS Generally, this is a safe procedure. However, problems can occur and include:  Bleeding from a tear in the lining of the esophagus.  A hole (perforation) in the esophagus. BEFORE THE PROCEDURE  Do not eat or drink anything after midnight on the night before the procedure or as directed by your health care provider.  Ask your health care provider about changing or stopping your regular medicines. This is especially important if you are taking diabetes medicines or blood thinners.  Plan to have someone take you home after the procedure. PROCEDURE   You will be given a medicine that makes you relaxed and sleepy (sedative).  A medicine may be sprayed or gargled to numb the back of the throat.  Your health care provider can use various instruments to do an esophageal dilatation. During the procedure, the instrument used will be placed in your mouth and passed down into your esophagus. Options include:  Simple dilators. This instrument is carefully placed in the esophagus to stretch it.  Guided wire bougies. In this method, a flexible tube (endoscope) is used to insert a wire into the esophagus. The dilator is passed over this wire to enlarge the esophagus. Then the wire is removed.  Balloon dilators. An endoscope with a small balloon at the end is passed down into the esophagus. Inflating the balloon gently stretches the esophagus and opens it up. AFTER THE PROCEDURE  Your blood pressure, heart rate, breathing rate,  and blood oxygen level will be monitored often until the medicines you were given have worn off.  Your throat may feel slightly sore and will probably still feel numb. This will improve slowly over time.  You will not be allowed to eat or drink until the throat numbness has resolved.  If this is a same-day procedure, you may be allowed to go home once you have been able to drink, urinate, and sit on the edge of the bed without nausea or dizziness.  If this is a same-day procedure, you should have a friend or family member with you for the next 24 hours after the procedure.   This information is not intended to replace advice given to you by your health care provider. Make sure you discuss any questions you have with your health care provider.  Document Released: 09/23/2005 Document Revised: 08/23/2014 Document Reviewed: 12/12/2013 Elsevier Interactive Patient Education 2016 [employer]. Colonoscopy A colonoscopy is an exam to look at the entire large intestine (colon). This exam can help find problems such as tumors, polyps, inflammation, and areas of bleeding. The exam takes about 1 hour.  LET [facility] CARE PROVIDER KNOW ABOUT:   Any allergies you have.  All medicines you are taking, including vitamins, herbs, eye drops, creams, and over-the-counter medicines.  Previous problems you or members of your family have had with the use of anesthetics.  Any blood disorders you have.  Previous surgeries you have had.  Medical conditions you have. RISKS AND COMPLICATIONS  Generally, this is a safe procedure. However, as with any procedure, complications can occur. Possible complications include:  Bleeding.  Tearing or rupture of the colon wall.  Reaction to medicines given during the exam.  Infection (rare). BEFORE THE PROCEDURE   Ask your health care provider about changing or stopping your regular medicines.  You may be prescribed an oral bowel prep. This involves drinking  a large amount of medicated liquid, starting the day before your procedure. The liquid will cause you to have multiple loose stools until your stool is almost clear or light green. This cleans out your colon in preparation for the procedure.  Do not eat or drink anything else once you have started the bowel prep, unless your health care provider tells you it is safe to do so.  Arrange for someone to drive you home after the procedure. PROCEDURE   You will be given medicine to help you relax (sedative).  You will lie on your side with your knees bent.  A long, flexible tube with a light and camera on the end (colonoscope) will be inserted through the rectum and into the colon. The camera sends video back to a computer screen as it moves through the colon. The colonoscope also releases carbon dioxide gas to inflate the colon. This helps your health care provider see the area better.  During the exam, your health care provider may take a small tissue sample (biopsy) to be examined under a microscope if any abnormalities are found.  The exam is finished when the entire colon has been viewed. AFTER THE PROCEDURE   Do not drive for 24 hours after the exam.  You may have a small amount of blood in your stool.  You may pass moderate amounts of gas and have mild abdominal cramping or bloating. This is caused by the gas used to inflate your colon during the exam.  Ask when your test results will be ready and how you will get your results. Make sure you get your test results.   This information is not intended to replace advice given to you by your health care provider. Make sure you discuss any questions you have with your health care provider.   Document Released: 07/30/2000 Document Revised: 05/23/2013 Document Reviewed: 04/09/2013 Elsevier Interactive Patient Education 2016 [employer]vier Inc. Colonoscopy, Care After Refer to this sheet in the next few weeks. These instructions provide you with  information on caring for yourself after your procedure. Your health care provider may also give you more specific instructions. Your treatment has been planned according to current medical practices, but problems sometimes occur. Call your health care provider if you have any problems or questions after your procedure. WHAT TO EXPECT AFTER THE PROCEDURE  After your procedure, it is typical to have the following:  A small  amount of blood in your stool.  Moderate amounts of gas and mild abdominal cramping or bloating. HOME CARE INSTRUCTIONS  Do not drive, operate machinery, or sign important documents for 24 hours.  You may shower and resume your regular physical activities, but move at a slower pace for the first 24 hours.  Take frequent rest periods for the first 24 hours.  Walk around or put a warm pack on your abdomen to help reduce abdominal cramping and bloating.  Drink enough fluids to keep your urine clear or pale yellow.  You may resume your normal diet as instructed by your health care provider. Avoid heavy or fried foods that are hard to digest.  Avoid drinking alcohol for 24 hours or as instructed by your health care provider.  Only take over-the-counter or prescription medicines as directed by your health care provider.  If a tissue sample (biopsy) was taken during your procedure:  Do not take aspirin or blood thinners for 7 days, or as instructed by your health care provider.  Do not drink alcohol for 7 days, or as instructed by your health care provider.  Eat soft foods for the first 24 hours. SEEK MEDICAL CARE IF: You have persistent spotting of blood in your stool 2-3 days after the procedure. SEEK IMMEDIATE MEDICAL CARE IF:  You have more than a small spotting of blood in your stool.  You pass large blood clots in your stool.  Your abdomen is swollen (distended).  You have nausea or vomiting.  You have a fever.  You have increasing abdominal pain that is  not relieved with medicine.   This information is not intended to replace advice given to you by your health care provider. Make sure you discuss any questions you have with your health care provider.   Document Released: 03/16/2004 Document Revised: 05/23/2013 Document Reviewed: 04/09/2013 Elsevier Interactive Patient Education 2016 Elsevier Inc. PATIENT INSTRUCTIONS POST-ANESTHESIA  IMMEDIATELY FOLLOWING SURGERY:  Do not drive or operate machinery for the first twenty four hours after surgery.  Do not make any important decisions for twenty four hours after surgery or while taking narcotic pain medications or sedatives.  If you develop intractable nausea and vomiting or a severe headache please notify your doctor immediately.  FOLLOW-UP:  Please make an appointment with your surgeon as instructed. You do not need to follow up with anesthesia unless specifically instructed to do so.  WOUND CARE INSTRUCTIONS (if applicable):  Keep a dry clean dressing on the anesthesia/puncture wound site if there is drainage.  Once the wound has quit draining you may leave it open to air.  Generally you should leave the bandage intact for twenty four hours unless there is drainage.  If the epidural site drains for more than 36-48 hours please call the anesthesia department.  QUESTIONS?:  Please feel free to call your physician or the hospital operator if you have any questions, and they will be happy to assist you.

## 2016-01-25 LAB — TISSUE TRANSGLUTAMINASE, IGA: Tissue Transglutaminase Ab, IgA: <2 U/mL (ref 0–3)

## 2016-01-25 NOTE — Assessment & Plan Note (Addendum)
Present prior to cholecystectomy. Likely IBS. Low-volume hematochezia likely benign anorectal source; she has never had a colonoscopy. Will proceed with lower GI evaluation at time of EGD.   Proceed with TCS with Dr. Jena Gaussourk in near future: the risks, benefits, and alternatives have been discussed with the patient in detail. The patient states understanding and desires to proceed. PROPOFOL due to polypharmacy Bentyl QID. Not a candidate for Viberzi due to post-cholecystectomy Check celiac panel Return after procedure

## 2016-01-25 NOTE — Assessment & Plan Note (Signed)
50 year old female with chronic GERD, reported history of Barrett's (with last EGD in 2014 [facility] but not available at time of procedure) with refractory GERD and epigastric pain despite omeprazole 40 mg daily. Solid food and liquid dysphagia noted. Significant psychosocial stressors noted. Needs EGD for dyspepsia and Barrett's surveillance now. Will need to obtain procedure notes from [place]. May benefit from different PPI.   Proceed with upper endoscopy +/- dilation in the near future with Dr. Jena Gaussourk. The risks, benefits, and alternatives have been discussed in detail with patient. They have stated understanding and desire to proceed.  PROPOFOL due to polypharmacy Retrieve procedure notes

## 2016-01-26 ENCOUNTER — Ambulatory Visit (HOSPITAL_COMMUNITY): Payer: BLUE CROSS/BLUE SHIELD | Admitting: Anesthesiology

## 2016-01-26 ENCOUNTER — Encounter (HOSPITAL_COMMUNITY): Payer: Self-pay | Admitting: *Deleted

## 2016-01-26 ENCOUNTER — Encounter (HOSPITAL_COMMUNITY)
Admission: RE | Disposition: A | Discharge status: Home / Self Care | Payer: Self-pay | Source: Ambulatory Visit | Attending: Internal Medicine

## 2016-01-26 ENCOUNTER — Ambulatory Visit (HOSPITAL_COMMUNITY)
Admission: RE | Admit: 2016-01-26 | Discharge: 2016-01-26 | Disposition: A | Discharge status: Home / Self Care | Payer: BLUE CROSS/BLUE SHIELD | Source: Ambulatory Visit | Attending: Internal Medicine | Admitting: Internal Medicine

## 2016-01-26 DIAGNOSIS — R1310 Dysphagia, unspecified: Secondary | ICD-10-CM | POA: Diagnosis not present

## 2016-01-26 DIAGNOSIS — I10 Essential (primary) hypertension: Secondary | ICD-10-CM | POA: Diagnosis not present

## 2016-01-26 DIAGNOSIS — Z86010 Personal history of colonic polyps: Secondary | ICD-10-CM | POA: Insufficient documentation

## 2016-01-26 DIAGNOSIS — K2270 Barrett's esophagus without dysplasia: Secondary | ICD-10-CM | POA: Insufficient documentation

## 2016-01-26 DIAGNOSIS — K229 Disease of esophagus, unspecified: Secondary | ICD-10-CM | POA: Diagnosis not present

## 2016-01-26 DIAGNOSIS — K2950 Unspecified chronic gastritis without bleeding: Secondary | ICD-10-CM | POA: Diagnosis not present

## 2016-01-26 DIAGNOSIS — Z9049 Acquired absence of other specified parts of digestive tract: Secondary | ICD-10-CM | POA: Insufficient documentation

## 2016-01-26 DIAGNOSIS — F17210 Nicotine dependence, cigarettes, uncomplicated: Secondary | ICD-10-CM | POA: Insufficient documentation

## 2016-01-26 DIAGNOSIS — K921 Melena: Secondary | ICD-10-CM | POA: Insufficient documentation

## 2016-01-26 DIAGNOSIS — K3189 Other diseases of stomach and duodenum: Secondary | ICD-10-CM | POA: Insufficient documentation

## 2016-01-26 DIAGNOSIS — F419 Anxiety disorder, unspecified: Secondary | ICD-10-CM | POA: Insufficient documentation

## 2016-01-26 DIAGNOSIS — Z79899 Other long term (current) drug therapy: Secondary | ICD-10-CM | POA: Insufficient documentation

## 2016-01-26 DIAGNOSIS — R12 Heartburn: Secondary | ICD-10-CM | POA: Insufficient documentation

## 2016-01-26 DIAGNOSIS — K635 Polyp of colon: Secondary | ICD-10-CM | POA: Diagnosis not present

## 2016-01-26 DIAGNOSIS — K2289 Other specified disease of esophagus: Secondary | ICD-10-CM | POA: Insufficient documentation

## 2016-01-26 DIAGNOSIS — K648 Other hemorrhoids: Secondary | ICD-10-CM | POA: Diagnosis not present

## 2016-01-26 DIAGNOSIS — K219 Gastro-esophageal reflux disease without esophagitis: Secondary | ICD-10-CM | POA: Insufficient documentation

## 2016-01-26 DIAGNOSIS — K529 Noninfective gastroenteritis and colitis, unspecified: Secondary | ICD-10-CM | POA: Diagnosis not present

## 2016-01-26 HISTORY — PX: ESOPHAGOGASTRODUODENOSCOPY (EGD) WITH PROPOFOL: SHX5813

## 2016-01-26 HISTORY — PX: MALONEY DILATION: SHX5535

## 2016-01-26 HISTORY — PX: BIOPSY: SHX5522

## 2016-01-26 HISTORY — PX: POLYPECTOMY: SHX5525

## 2016-01-26 HISTORY — PX: COLONOSCOPY WITH PROPOFOL: SHX5780

## 2016-01-26 SURGERY — COLONOSCOPY WITH PROPOFOL
Anesthesia: Monitor Anesthesia Care

## 2016-01-26 MED ORDER — PROPOFOL 500 MG/50ML IV EMUL
INTRAVENOUS | Status: DC | PRN
  Administered 2016-01-26 (×2): via INTRAVENOUS
  Administered 2016-01-26: 100 ug/kg/min via INTRAVENOUS

## 2016-01-26 MED ORDER — PROPOFOL 10 MG/ML IV BOLUS
INTRAVENOUS | Status: AC
  Filled 2016-01-26: qty 20

## 2016-01-26 MED ORDER — FENTANYL CITRATE (PF) 100 MCG/2ML IJ SOLN
INTRAMUSCULAR | Status: AC
  Filled 2016-01-26: qty 2

## 2016-01-26 MED ORDER — MIDAZOLAM HCL 5 MG/5ML IJ SOLN
INTRAMUSCULAR | Status: DC | PRN
  Administered 2016-01-26: 2 mg via INTRAVENOUS

## 2016-01-26 MED ORDER — ONDANSETRON HCL 4 MG/2ML IJ SOLN
4.0000 mg | Freq: Once | INTRAMUSCULAR | Status: AC
  Administered 2016-01-26: 4 mg via INTRAVENOUS

## 2016-01-26 MED ORDER — FENTANYL CITRATE (PF) 100 MCG/2ML IJ SOLN: 25.0000 ug | INTRAMUSCULAR | Status: DC | PRN

## 2016-01-26 MED ORDER — MIDAZOLAM HCL 2 MG/2ML IJ SOLN
INTRAMUSCULAR | Status: AC
  Filled 2016-01-26: qty 2

## 2016-01-26 MED ORDER — MIDAZOLAM HCL 2 MG/2ML IJ SOLN
1.0000 mg | INTRAMUSCULAR | Status: DC | PRN
  Administered 2016-01-26: 2 mg via INTRAVENOUS

## 2016-01-26 MED ORDER — GLYCOPYRROLATE 0.2 MG/ML IJ SOLN
0.2000 mg | Freq: Once | INTRAMUSCULAR | Status: AC | PRN
  Administered 2016-01-26: 0.2 mg via INTRAVENOUS

## 2016-01-26 MED ORDER — LIDOCAINE VISCOUS 2 % MT SOLN
OROMUCOSAL | Status: AC
  Filled 2016-01-26: qty 15

## 2016-01-26 MED ORDER — GLYCOPYRROLATE 0.2 MG/ML IJ SOLN
INTRAMUSCULAR | Status: AC
  Filled 2016-01-26: qty 1

## 2016-01-26 MED ORDER — LIDOCAINE VISCOUS 2 % MT SOLN
5.0000 mL | Freq: Two times a day (BID) | OROMUCOSAL | Status: DC
  Administered 2016-01-26 (×2): 5 mL via OROMUCOSAL

## 2016-01-26 MED ORDER — LACTATED RINGERS IV SOLN
INTRAVENOUS | Status: DC
  Administered 2016-01-26: 1000 mL via INTRAVENOUS

## 2016-01-26 MED ORDER — ONDANSETRON HCL 4 MG/2ML IJ SOLN: 4.0000 mg | Freq: Once | INTRAMUSCULAR | Status: DC | PRN

## 2016-01-26 MED ORDER — FENTANYL CITRATE (PF) 100 MCG/2ML IJ SOLN
25.0000 ug | INTRAMUSCULAR | Status: AC | PRN
  Administered 2016-01-26 (×2): 25 ug via INTRAVENOUS

## 2016-01-26 MED ORDER — PROPOFOL 10 MG/ML IV BOLUS
INTRAVENOUS | Status: AC
  Filled 2016-01-26: qty 40

## 2016-01-26 MED ORDER — ONDANSETRON HCL 4 MG/2ML IJ SOLN
INTRAMUSCULAR | Status: AC
  Filled 2016-01-26: qty 2

## 2016-01-26 NOTE — Op Note (Signed)
[facility] Patient Name: Jocelyn Richard Procedure Date: 01/26/2016 8:05 AM MRN: [id number] Date of Birth: [birth year] Attending MD: Gennette Pac , MD CSN: [id number] Age: 50 Admit Type: Outpatient Procedure:                Upper GI endoscopy with [facility] dilation, biopsy Indications:              Dysphagia, Heartburn Providers:                Gennette Pac, MD, Brain Hilts, RN, Calton Dach, Technician Referring MD:              Medicines:                Propofol per Anesthesia Complications:            No immediate complications. Estimated Blood Loss:     Estimated blood loss was minimal. Procedure:                Pre-Anesthesia Assessment:                           - Prior to the procedure, a History and Physical                            was performed, and patient medications and                            allergies were reviewed. The patient's tolerance of                            previous anesthesia was also reviewed. The risks                            and benefits of the procedure and the sedation                            options and risks were discussed with the patient.                            All questions were answered, and informed consent                            was obtained. ASA Grade Assessment: II - A patient                            with mild systemic disease. After reviewing the                            risks and benefits, the patient was deemed in                            satisfactory condition to undergo the procedure.  After obtaining informed consent, the endoscope was                            passed under direct vision. Throughout the                            procedure, the patient's blood pressure, pulse, and                            oxygen saturations were monitored continuously. The                            [id number]) was introduced through the mouth,                            and advanced to the second part of duodenum. The                            upper GI endoscopy was accomplished without                            difficulty. The patient tolerated the procedure                            well. Scope In: 8:28:52 AM Scope Out: 8:36:30 AM Total Procedure Duration: 0 hours 7 minutes 38 seconds  Findings:      Barrett's esophagus was present at the gastroesophageal junction. The       scope was withdrawn. Dilation was performed with a Maloney dilator with       no resistance at 54 Fr. The dilation site was examined following       endoscope reinsertion and showed no change. Estimated blood loss: none.       This was biopsied with a cold forceps for histology. Estimated blood       loss was minimal.      Diffuse nodular mucosa was found in the stomach.      The second portion of the duodenum was normal. This was biopsied with a       cold forceps for histology. Impression:               - Abnormal distal esophagus suspicious for                            Barrett's esophagus.Dilated. Biopsied.                           - Nodular mucosa in the stomach. biopsied.                           - Normal second portion of the duodenum. Although                            multiple photographs of this procedure were taken,                            Because  of technical difficulties images were not                            captured. Moderate Sedation:      Moderate (conscious) sedation was personally administered by an       anesthesia professional. The following parameters were monitored: oxygen       saturation, heart rate, blood pressure, respiratory rate, EKG, adequacy       of pulmonary ventilation, and response to care. Total physician       intraservice time was 17 minutes. Recommendation:           - Patient has a contact number available for                            emergencies. The signs and symptoms of potential                             delayed complications were discussed with the                            patient. Return to normal activities tomorrow.                            Written discharge instructions were provided to the                            patient.                           - Advance diet as tolerated.                           - Continue present medications except stop                            omeprazole. Begin Dexilant 60 mg daily; 3 week                            supply of samples.                           - Await pathology results.                           - Repeat upper endoscopy (date not yet determined)                            for surveillance based on pathology results.                           - Return to GI clinic in 8 weeks. Procedure Code(s):        --- Professional ---                           [phone], Esophagogastroduodenoscopy, flexible,  transoral; with biopsy, single or multiple                           43450, Dilation of esophagus, by unguided sound or                            bougie, single or multiple passes Diagnosis Code(s):        --- Professional ---                           K22.70, Barrett's esophagus without dysplasia                           K31.89, Other diseases of stomach and duodenum                           R13.10, Dysphagia, unspecified                           R12, Heartburn CPT copyright 2016 [employer]. All rights reserved. The codes documented in this report are preliminary and upon coder review may  be revised to meet current compliance requirements. Gerrit Friendsobert M. Cornelis Kluver, MD Gennette Pacobert Michael Apolinar Bero, MD 01/26/2016 9:16:41 AM This report has been signed electronically. Number of Addenda: 0

## 2016-01-26 NOTE — Op Note (Signed)
[facility] Patient Name: Jocelyn Richard Procedure Date: 01/26/2016 8:40 AM MRN: [id number] Date of Birth: 08/21/1965 Attending MD: Gennette Pac , MD CSN: [id number] Age: 50 Admit Type: Outpatient Procedure:                Ileo-colonoscopy with snare polypectomy and biopsy Indications:              Chronic diarrhea, Hematochezia Providers:                Gennette Pac, MD, Brain Hilts, RN, Calton Dach, Technician Referring MD:              Medicines:                Propofol per Anesthesia Complications:            No immediate complications. Estimated Blood Loss:     Estimated blood loss was minimal. Procedure:                Pre-Anesthesia Assessment:                           - Prior to the procedure, a History and Physical                            was performed, and patient medications and                            allergies were reviewed. The patient's tolerance of                            previous anesthesia was also reviewed. The risks                            and benefits of the procedure and the sedation                            options and risks were discussed with the patient.                            All questions were answered, and informed consent                            was obtained. ASA Grade Assessment: II - A patient                            with mild systemic disease. After reviewing the                            risks and benefits, the patient was deemed in                            satisfactory condition to undergo the procedure.  After obtaining informed consent, the colonoscope                            was passed under direct vision. Throughout the                            procedure, the patient's blood pressure, pulse, and                            oxygen saturations were monitored continuously. The                            EC-[id number] ([id number] scope was introduced  through                            the anus and advanced to the the terminal ileum.                            The colonoscopy was performed without difficulty.                            The patient tolerated the procedure well. The                            quality of the bowel preparation was adequate. The                            terminal ileum, ileocecal valve, appendiceal                            orifice, and rectum were photographed. Scope In: 8:43:06 AM Scope Out: 9:03:44 AM Scope Withdrawal Time: 0 hours 11 minutes 28 seconds  Total Procedure Duration: 0 hours 20 minutes 38 seconds  Findings:      The perianal and digital rectal examinations were normal.      (3) 4 mm polyp were found in the sigmoid colon. The polyps were       semi-pedunculated. The polyps were removed with a cold snare. Resection       and retrieval were complete. Estimated blood loss was minimal. Segmental       biopsies of the left and right colon were taken for histologic study.      Retroflexion in the rectum was not performed due to rectal vault was       small. Rectal mucosa seen well on Face. Interna hemorrhoids present.. Impression:               - 3 polyps in the sigmoid colon, removed with a                            cold snare. Resected and retrieved. Internal                            hemorrhoids. Status post segmental biopsy. Moderate Sedation:      Moderate (conscious) sedation was personally administered by an       anesthesia professional. The  following parameters were monitored: oxygen       saturation, heart rate, blood pressure, respiratory rate, EKG, adequacy       of pulmonary ventilation, and response to care. Total physician       intraservice time was 44 minutes. Recommendation:           - Patient has a contact number available for                            emergencies. The signs and symptoms of potential                            delayed complications were discussed with the                             patient. Return to normal activities tomorrow.                            Written discharge instructions were provided to the                            patient.                           - Advance diet as tolerated.                           - Continue present medications.                           - Await pathology results.                           - Repeat colonoscopy date to be determined after                            pending pathology results are reviewed for                            surveillance based on pathology results.                           - Return to GI office in 8 weeks. Procedure Code(s):        --- Professional ---                           [phone], Colonoscopy, flexible; with removal of                            tumor(s), polyp(s), or other lesion(s) by snare                            technique Diagnosis Code(s):        --- Professional ---                           D12.5, Benign neoplasm of  sigmoid colon                           K52.9, Noninfective gastroenteritis and colitis,                            unspecified                           K92.1, Melena (includes Hematochezia) CPT copyright 2016 American Medical Association. All rights reserved. The codes documented in this report are preliminary and upon coder review may  be revised to meet current compliance requirements. Gerrit Friends. Psalms Olarte, MD Gennette Pac, MD 01/26/2016 9:21:57 AM This report has been signed electronically. Number of Addenda: 0

## 2016-01-26 NOTE — Progress Notes (Signed)
cc'ed to pcp °

## 2016-01-26 NOTE — Transfer of Care (Signed)
Immediate Anesthesia Transfer of Care Note  Patient: Jocelyn Richard  Procedure(s) Performed: Procedure(s) with comments: COLONOSCOPY WITH PROPOFOL (N/A) - 815 ESOPHAGOGASTRODUODENOSCOPY (EGD) WITH PROPOFOL (N/A) MALONEY DILATION (N/A) BIOPSY - gastric POLYPECTOMY - sigmoid colon  Patient Location: PACU  Anesthesia Type:MAC  Level of Consciousness: awake and patient cooperative  Airway & Oxygen Therapy: Patient Spontanous Breathing and Patient connected to face mask oxygen  Post-op Assessment: Report given to RN, Post -op Vital signs reviewed and stable and Patient moving all extremities  Post vital signs: Reviewed and stable  Last Vitals:  Filed Vitals:   01/26/16 0810 01/26/16 0815  BP: 106/72 119/75  Pulse:    Temp:    Resp: 16 20    Last Pain: There were no vitals filed for this visit.    Patients Stated Pain Goal: 9 (01/26/16 0750)  Complications: No apparent anesthesia complications

## 2016-01-26 NOTE — Anesthesia Postprocedure Evaluation (Signed)
Anesthesia Post Note  Patient: Jocelyn Richard  Procedure(s) Performed: Procedure(s) (LRB): COLONOSCOPY WITH PROPOFOL (N/A) ESOPHAGOGASTRODUODENOSCOPY (EGD) WITH PROPOFOL (N/A) MALONEY DILATION (N/A) BIOPSY POLYPECTOMY  Patient location during evaluation: PACU Anesthesia Type: MAC Level of consciousness: awake, oriented and patient cooperative Pain management: pain level controlled Vital Signs Assessment: post-procedure vital signs reviewed and stable Respiratory status: spontaneous breathing, nonlabored ventilation and respiratory function stable Cardiovascular status: blood pressure returned to baseline Postop Assessment: no signs of nausea or vomiting Anesthetic complications: no    Last Vitals:  Filed Vitals:   01/26/16 0810 01/26/16 0815  BP: 106/72 119/75  Pulse:    Temp:    Resp: 16 20    Last Pain: There were no vitals filed for this visit.               Eluterio Seymour J

## 2016-01-26 NOTE — Interval H&P Note (Signed)
History and Physical Interval Note:  01/26/2016 8:17 AM  Jocelyn Richard  has presented today for surgery, with the diagnosis of diarrhea/gerd  The various methods of treatment have been discussed with the patient and family. After consideration of risks, benefits and other options for treatment, the patient has consented to  Procedure(s) with comments: COLONOSCOPY WITH PROPOFOL (N/A) - 815 ESOPHAGOGASTRODUODENOSCOPY (EGD) WITH PROPOFOL (N/A) MALONEY DILATION (N/A) as a surgical intervention .  The patient's history has been reviewed, patient examined, no change in status, stable for surgery.  I have reviewed the patient's chart and labs.  Questions were answered to the patient's satisfaction.     [name]  No change. Diagnostic EGD/ED and colonoscopy per plan.  The risks, benefits, limitations, imponderables and alternatives regarding both EGD and colonoscopy have been reviewed with the patient. Questions have been answered. All parties agreeable.

## 2016-01-26 NOTE — Anesthesia Preprocedure Evaluation (Signed)
Anesthesia Evaluation  Patient identified by MRN, date of birth, ID band Patient awake    Reviewed: Allergy & Precautions, NPO status , Patient's Chart, lab work & pertinent test results  Airway Mallampati: II  TM Distance: >3 FB     Dental  (+) Teeth Intact, Dental Advisory Given, Missing,    Pulmonary Current Smoker,    breath sounds clear to auscultation       Cardiovascular hypertension, Pt. on medications  Rhythm:Regular Rate:Normal     Neuro/Psych PSYCHIATRIC DISORDERS Anxiety    GI/Hepatic PUD, GERD  Poorly Controlled,  Endo/Other    Renal/GU      Musculoskeletal   Abdominal   Peds  Hematology   Anesthesia Other Findings   Reproductive/Obstetrics                             Anesthesia Physical Anesthesia Plan  ASA: II  Anesthesia Plan: MAC   Post-op Pain Management:    Induction: Intravenous  Airway Management Planned: Simple Face Mask  Additional Equipment:   Intra-op Plan:   Post-operative Plan:   Informed Consent: I have reviewed the patients History and Physical, chart, labs and discussed the procedure including the risks, benefits and alternatives for the proposed anesthesia with the patient or authorized representative who has indicated his/her understanding and acceptance.     Plan Discussed with:   Anesthesia Plan Comments:         Anesthesia Quick Evaluation

## 2016-01-26 NOTE — Discharge Instructions (Signed)
EGD Discharge instructions Please read the instructions outlined below and refer to this sheet in the next few weeks. These discharge instructions provide you with general information on caring for yourself after you leave the hospital. Your doctor may also give you specific instructions. While your treatment has been planned according to the most current medical practices available, unavoidable complications occasionally occur. If you have any problems or questions after discharge, please call your doctor. ACTIVITY  You may resume your regular activity but move at a slower pace for the next 24 hours.   Take frequent rest periods for the next 24 hours.   Walking will help expel (get rid of) the air and reduce the bloated feeling in your abdomen.   No driving for 24 hours (because of the anesthesia (medicine) used during the test).   You may shower.   Do not sign any important legal documents or operate any machinery for 24 hours (because of the anesthesia used during the test).  NUTRITION  Drink plenty of fluids.   You may resume your normal diet.   Begin with a light meal and progress to your normal diet.   Avoid alcoholic beverages for 24 hours or as instructed by your caregiver.  MEDICATIONS  You may resume your normal medications unless your caregiver tells you otherwise.  WHAT YOU CAN EXPECT TODAY  You may experience abdominal discomfort such as a feeling of fullness or gas pains.  FOLLOW-UP  Your doctor will discuss the results of your test with you.  SEEK IMMEDIATE MEDICAL ATTENTION IF ANY OF THE FOLLOWING OCCUR:  Excessive nausea (feeling sick to your stomach) and/or vomiting.   Severe abdominal pain and distention (swelling).   Trouble swallowing.   Temperature over 101 F (37.8 C).   Rectal bleeding or vomiting of blood.     Colonoscopy Discharge Instructions  Read the instructions outlined below and refer to this sheet in the next few weeks. These  discharge instructions provide you with general information on caring for yourself after you leave the hospital. Your doctor may also give you specific instructions. While your treatment has been planned according to the most current medical practices available, unavoidable complications occasionally occur. If you have any problems or questions after discharge, call Dr. Gala Romney at [phone]. ACTIVITY  You may resume your regular activity, but move at a slower pace for the next 24 hours.   Take frequent rest periods for the next 24 hours.   Walking will help get rid of the air and reduce the bloated feeling in your belly (abdomen).   No driving for 24 hours (because of the medicine (anesthesia) used during the test).    Do not sign any important legal documents or operate any machinery for 24 hours (because of the anesthesia used during the test).  NUTRITION  Drink plenty of fluids.   You may resume your normal diet as instructed by your doctor.   Begin with a light meal and progress to your normal diet. Heavy or fried foods are harder to digest and may make you feel sick to your stomach (nauseated).   Avoid alcoholic beverages for 24 hours or as instructed.  MEDICATIONS  You may resume your normal medications unless your doctor tells you otherwise.  WHAT YOU CAN EXPECT TODAY  Some feelings of bloating in the abdomen.   Passage of more gas than usual.   Spotting of blood in your stool or on the toilet paper.  IF YOU HAD POLYPS REMOVED DURING  THE COLONOSCOPY:  No aspirin products for 7 days or as instructed.   No alcohol for 7 days or as instructed.   Eat a soft diet for the next 24 hours.  FINDING OUT THE RESULTS OF YOUR TEST Not all test results are available during your visit. If your test results are not back during the visit, make an appointment with your caregiver to find out the results. Do not assume everything is normal if you have not heard from your caregiver or the  medical facility. It is important for you to follow up on all of your test results.  SEEK IMMEDIATE MEDICAL ATTENTION IF:  You have more than a spotting of blood in your stool.   Your belly is swollen (abdominal distention).   You are nauseated or vomiting.   You have a temperature over 101.   You have abdominal pain or discomfort that is severe or gets worse throughout the day.    GERD information provided  Stop omeprazole; 3 week course of Dexilant 60 mg daily-go by my office for free samples.  Colon polyp information provided  Further recommendations to follow pending review of pathology report   Colon Polyps Polyps are lumps of extra tissue growing inside the body. Polyps can grow in the large intestine (colon). Most colon polyps are noncancerous (benign). However, some colon polyps can become cancerous over time. Polyps that are larger than a pea may be harmful. To be safe, caregivers remove and test all polyps. CAUSES  Polyps form when mutations in the genes cause your cells to grow and divide even though no more tissue is needed. RISK FACTORS There are a number of risk factors that can increase your chances of getting colon polyps. They include: Being older than 50 years. Family history of colon polyps or colon cancer. Long-term colon diseases, such as colitis or Crohn disease. Being overweight. Smoking. Being inactive. Drinking too much alcohol. SYMPTOMS  Most small polyps do not cause symptoms. If symptoms are present, they may include: Blood in the stool. The stool may look dark red or black. Constipation or diarrhea that lasts longer than 1 week. DIAGNOSIS People often do not know they have polyps until their caregiver finds them during a regular checkup. Your caregiver can use 4 tests to check for polyps: Digital rectal exam. The caregiver wears gloves and feels inside the rectum. This test would find polyps only in the rectum. Barium enema. The caregiver puts a  liquid called barium into your rectum before taking X-rays of your colon. Barium makes your colon look white. Polyps are dark, so they are easy to see in the X-ray pictures. Sigmoidoscopy. A thin, flexible tube (sigmoidoscope) is placed into your rectum. The sigmoidoscope has a light and tiny camera in it. The caregiver uses the sigmoidoscope to look at the last third of your colon. Colonoscopy. This test is like sigmoidoscopy, but the caregiver looks at the entire colon. This is the most common method for finding and removing polyps. TREATMENT  Any polyps will be removed during a sigmoidoscopy or colonoscopy. The polyps are then tested for cancer. PREVENTION  To help lower your risk of getting more colon polyps: Eat plenty of fruits and vegetables. Avoid eating fatty foods. Do not smoke. Avoid drinking alcohol. Exercise every day. Lose weight if recommended by your caregiver. Eat plenty of calcium and folate. Foods that are rich in calcium include milk, cheese, and broccoli. Foods that are rich in folate include chickpeas, kidney beans, and spinach.  HOME CARE INSTRUCTIONS Keep all follow-up appointments as directed by your caregiver. You may need periodic exams to check for polyps. SEEK MEDICAL CARE IF: You notice bleeding during a bowel movement.   This information is not intended to replace advice given to you by your health care provider. Make sure you discuss any questions you have with your health care provider.   Document Released: 04/28/2004 Document Revised: 08/23/2014 Document Reviewed: 10/12/2011 Elsevier Interactive Patient Education [employer]

## 2016-01-26 NOTE — H&P (View-Only) (Signed)
  Primary Care Physician:  MANN, BENJAMIN, PA-C Primary Gastroenterologist:  Dr. Rourk   Chief Complaint  Patient presents with  . Gastroesophageal Reflux  . Abdominal Pain    HPI:   Jocelyn Richard is a 50 y.o. female presenting today at the request of her PCP secondary to GERD and abdominal pain. She is s/p cholecystectomy in July 2016.   States she hurts from her esophagus all the way down to her upper abdomen. Omeprazole 40 mg a day. Has been on Protonix prior to this. Feels like it may have helped better than omeprazole. Difficulty swallowing at times. Solid food and liquid dysphagia.   [place] had an EGD 2014 : states she had ulcers in her stomach and Barrett's. No surveillance since 2014. WE DO NOT HAVE PROCEDURE NOTES AT TIME OF VISIT.   Has a chronic history of abdominal bloating, chronic diarrhea even prior to cholecystectomy. Stress causes worse discomfort. Upon waking starts having loose stools. Went 15 times yesterday. Some low-volume hematochezia, burgundy stool. No prior colonoscopy. No unexplained weight loss.   Under significant stress, legal guardian of grandchildren. Daughter-in-law just passed away.   Past Medical History  Diagnosis Date  . Hypertension   . Stomach ulcer   . Esophagus, Barrett's   . Anxiety   . GERD (gastroesophageal reflux disease)     Past Surgical History  Procedure Laterality Date  . Abdominal hysterectomy    . Cholecystectomy N/A 03/03/2015    Procedure: LAPAROSCOPIC CHOLECYSTECTOMY;  Surgeon: Mark Jenkins, MD;  Location: [facility];  Service: General;  Laterality: N/A;  . Hernia repair      umbilical    Current Outpatient Prescriptions  Medication Sig Dispense Refill  . diazepam (VALIUM) 5 MG tablet TK 1 T PO QHS  5  . escitalopram (LEXAPRO) 10 MG tablet TK 1 T PO D  11  . omeprazole (PRILOSEC) 20 MG capsule Take 40 mg by mouth daily.     . triamterene-hydrochlorothiazide (DYAZIDE) 37.5-25 MG capsule TK 1 C PO D  11  . dicyclomine  (BENTYL) 10 MG capsule Take 1 capsule (10 mg total) by mouth 4 (four) times daily -  before meals and at bedtime. 120 capsule 3  . polyethylene glycol-electrolytes (TRILYTE) 420 g solution Take 4,000 mLs by mouth as directed. 4000 mL 0  . [DISCONTINUED] famotidine (PEPCID) 20 MG tablet Take 1 tablet (20 mg total) by mouth 2 (two) times daily. (Patient not taking: Reported on 12/13/2014) 30 tablet 0  . [DISCONTINUED] sucralfate (CARAFATE) 1 G tablet Take 1 tablet (1 g total) by mouth 4 (four) times daily -  with meals and at bedtime. (Patient not taking: Reported on 12/13/2014) 30 tablet 1   No current facility-administered medications for this visit.    Allergies as of 01/22/2016  . (No Known Allergies)    Family History  Problem Relation Age of Onset  . Colon cancer Neg Hx     Social History   Social History  . Marital Status: Divorced    Spouse Name: N/A  . Number of Children: N/A  . Years of Education: N/A   Occupational History  . Not on file.   Social History Main Topics  . Smoking status: Current Every Day Smoker -- 1.00 packs/day for 16 years    Types: Cigarettes  . Smokeless tobacco: Not on file  . Alcohol Use: Yes     Comment: occ  . Drug Use: No  . Sexual Activity: Yes    Birth   Control/ Protection: Surgical   Other Topics Concern  . Not on file   Social History Narrative    Review of Systems: As mentioned in HPI.   Physical Exam: BP 119/81 mmHg  Pulse 91  Temp(Src) 97.3 F (36.3 C)  Ht 5\' 3"  (1.6 m)  Wt 152 lb 3.2 oz (69.037 kg)  BMI 26.97 kg/m2 General:   Alert and oriented. Pleasant and cooperative. Well-nourished and well-developed.  Head:  Normocephalic and atraumatic. Eyes:  Without icterus, sclera clear and conjunctiva pink.  Ears:  Normal auditory acuity. Nose:  No deformity, discharge,  or lesions. Mouth:  No deformity or lesions, oral mucosa pink. Lungs:  Clear to auscultation bilaterally. No wheezes, rales, or rhonchi. No distress.    Heart:  S1, S2 present without murmurs appreciated.  Abdomen:  +BS, soft, non-tender and non-distended. No HSM noted. No guarding or rebound. No masses appreciated.  Rectal:  Deferred  Msk:  Symmetrical without gross deformities. Normal posture Extremities:  Without edema. Neurologic:  Alert and  oriented x4;  grossly normal neurologically. Skin:  Intact without significant lesions or rashes. Psych:  Alert and cooperative. Normal mood and affect.

## 2016-01-28 ENCOUNTER — Other Ambulatory Visit: Payer: Self-pay | Admitting: Gastroenterology

## 2016-01-28 ENCOUNTER — Encounter: Payer: Self-pay | Admitting: Internal Medicine

## 2016-01-28 ENCOUNTER — Other Ambulatory Visit: Payer: Self-pay

## 2016-01-28 DIAGNOSIS — K529 Noninfective gastroenteritis and colitis, unspecified: Secondary | ICD-10-CM

## 2016-01-28 NOTE — Progress Notes (Signed)
Quick Note:  IgA level was ordered at office visit, but it was never done. Can we see what happened? All of her other labs are back. ______

## 2016-01-29 ENCOUNTER — Encounter: Payer: Self-pay | Admitting: Internal Medicine

## 2016-01-29 ENCOUNTER — Encounter (HOSPITAL_COMMUNITY): Payer: Self-pay | Admitting: Internal Medicine

## 2016-01-29 ENCOUNTER — Telehealth: Payer: Self-pay

## 2016-01-29 NOTE — Progress Notes (Signed)
Pt left the office before signing a release of records and I had to mail it to her. [facility] I went ahead and faxed a request on letterhead to [facility] that we needed the records ASAP.

## 2016-01-29 NOTE — Telephone Encounter (Signed)
Per RMR- Send letter to patient.  Send copy of letter with path to referring provider and PCP.    Patient should have an office visit with us in about 8 weeks

## 2016-01-29 NOTE — Telephone Encounter (Signed)
Letter mailed to the pt. 

## 2016-01-29 NOTE — Progress Notes (Signed)
Jocelyn PikesSusan, can you request the reports from [place] regarding EGD and path reports? Thanks!

## 2016-01-30 ENCOUNTER — Encounter: Payer: Self-pay | Admitting: Gastroenterology

## 2016-01-30 NOTE — Progress Notes (Signed)
Received report from May 2014:  [place], Dr. Marva PandaSkulskie: Grade D erosive esophagitis, Z-line irregular and biopsied, hiatal hernia, gastritis, duodenal biopsy negative for celiac. Biopsy comment from [facility] states "if characteristic lesion was identified endoscopically within the GE junction, the histologic findings would fulfill criteria for barrett's.

## 2016-02-02 ENCOUNTER — Emergency Department (HOSPITAL_COMMUNITY): Payer: BLUE CROSS/BLUE SHIELD

## 2016-02-02 ENCOUNTER — Observation Stay (HOSPITAL_COMMUNITY)
Admission: EM | Admit: 2016-02-02 | Discharge: 2016-02-03 | Disposition: A | Discharge status: Home / Self Care | Payer: BLUE CROSS/BLUE SHIELD | Attending: Internal Medicine | Admitting: Internal Medicine

## 2016-02-02 ENCOUNTER — Encounter (HOSPITAL_COMMUNITY): Payer: Self-pay | Admitting: Emergency Medicine

## 2016-02-02 ENCOUNTER — Other Ambulatory Visit: Payer: Self-pay

## 2016-02-02 DIAGNOSIS — R079 Chest pain, unspecified: Secondary | ICD-10-CM | POA: Diagnosis not present

## 2016-02-02 DIAGNOSIS — K2270 Barrett's esophagus without dysplasia: Secondary | ICD-10-CM | POA: Diagnosis not present

## 2016-02-02 DIAGNOSIS — R0789 Other chest pain: Secondary | ICD-10-CM | POA: Diagnosis present

## 2016-02-02 DIAGNOSIS — Z720 Tobacco use: Secondary | ICD-10-CM | POA: Diagnosis present

## 2016-02-02 DIAGNOSIS — I10 Essential (primary) hypertension: Secondary | ICD-10-CM | POA: Diagnosis not present

## 2016-02-02 DIAGNOSIS — K210 Gastro-esophageal reflux disease with esophagitis: Secondary | ICD-10-CM | POA: Diagnosis not present

## 2016-02-02 DIAGNOSIS — Z23 Encounter for immunization: Secondary | ICD-10-CM | POA: Diagnosis not present

## 2016-02-02 DIAGNOSIS — Z79899 Other long term (current) drug therapy: Secondary | ICD-10-CM | POA: Diagnosis not present

## 2016-02-02 DIAGNOSIS — F17210 Nicotine dependence, cigarettes, uncomplicated: Secondary | ICD-10-CM | POA: Insufficient documentation

## 2016-02-02 DIAGNOSIS — K219 Gastro-esophageal reflux disease without esophagitis: Secondary | ICD-10-CM | POA: Diagnosis present

## 2016-02-02 LAB — BASIC METABOLIC PANEL
Anion gap: 6 (ref 5–15)
BUN: 13 mg/dL (ref 6–20)
CHLORIDE: 103 mmol/L (ref 101–111)
CO2: 28 mmol/L (ref 22–32)
CREATININE: 0.72 mg/dL (ref 0.44–1.00)
Calcium: 9.3 mg/dL (ref 8.9–10.3)
GFR calc non Af Amer: >60 mL/min (ref >60)
GLUCOSE: 101 mg/dL — AB (ref 65–99)
Potassium: 3.4 mmol/L — ABNORMAL LOW (ref 3.5–5.1)
Sodium: 137 mmol/L (ref 135–145)

## 2016-02-02 LAB — CBC
HCT: 45.6 % (ref 36.0–46.0)
Hemoglobin: 15.4 g/dL — ABNORMAL HIGH (ref 12.0–15.0)
MCH: 31.7 pg (ref 26.0–34.0)
MCHC: 33.8 g/dL (ref 30.0–36.0)
MCV: 93.8 fL (ref 78.0–100.0)
PLATELETS: 314 10*3/uL (ref 150–400)
RBC: 4.86 MIL/uL (ref 3.87–5.11)
RDW: 13.7 % (ref 11.5–15.5)
WBC: 9.2 10*3/uL (ref 4.0–10.5)

## 2016-02-02 LAB — TROPONIN I
Troponin I: 0.05 ng/mL — ABNORMAL HIGH (ref <0.031)
Troponin I: 0.06 ng/mL — ABNORMAL HIGH (ref <0.031)

## 2016-02-02 LAB — IGA: IgA: 91 mg/dL (ref 81–463)

## 2016-02-02 MED ORDER — ESCITALOPRAM OXALATE 10 MG PO TABS
10.0000 mg | ORAL_TABLET | Freq: Every day | ORAL | Status: DC
  Administered 2016-02-03: 10 mg via ORAL
  Filled 2016-02-02: qty 1

## 2016-02-02 MED ORDER — DIAZEPAM 5 MG PO TABS
5.0000 mg | ORAL_TABLET | Freq: Every day | ORAL | Status: DC
  Administered 2016-02-02 – 2016-02-03 (×2): 5 mg via ORAL
  Filled 2016-02-02 (×2): qty 1

## 2016-02-02 MED ORDER — ACETAMINOPHEN 325 MG PO TABS: 650.0000 mg | ORAL_TABLET | Freq: Four times a day (QID) | ORAL | Status: DC | PRN

## 2016-02-02 MED ORDER — SODIUM CHLORIDE 0.9 % IV SOLN: 250.0000 mL | INTRAVENOUS | Status: DC | PRN

## 2016-02-02 MED ORDER — SODIUM CHLORIDE 0.9% FLUSH
3.0000 mL | Freq: Two times a day (BID) | INTRAVENOUS | Status: DC
  Administered 2016-02-02: 3 mL via INTRAVENOUS

## 2016-02-02 MED ORDER — TRAMADOL HCL 50 MG PO TABS
100.0000 mg | ORAL_TABLET | Freq: Four times a day (QID) | ORAL | Status: DC | PRN
  Administered 2016-02-02: 100 mg via ORAL
  Filled 2016-02-02: qty 2

## 2016-02-02 MED ORDER — SODIUM CHLORIDE 0.9% FLUSH
3.0000 mL | Freq: Two times a day (BID) | INTRAVENOUS | Status: DC
  Administered 2016-02-03: 3 mL via INTRAVENOUS

## 2016-02-02 MED ORDER — PANTOPRAZOLE SODIUM 40 MG PO TBEC
80.0000 mg | DELAYED_RELEASE_TABLET | Freq: Every day | ORAL | Status: DC
  Administered 2016-02-02 – 2016-02-03 (×2): 80 mg via ORAL
  Filled 2016-02-02 (×2): qty 2

## 2016-02-02 MED ORDER — SODIUM CHLORIDE 0.9% FLUSH: 3.0000 mL | INTRAVENOUS | Status: DC | PRN

## 2016-02-02 MED ORDER — DICYCLOMINE HCL 10 MG PO CAPS
10.0000 mg | ORAL_CAPSULE | Freq: Three times a day (TID) | ORAL | Status: DC
  Administered 2016-02-02 – 2016-02-03 (×3): 10 mg via ORAL
  Filled 2016-02-02 (×3): qty 1

## 2016-02-02 MED ORDER — PNEUMOCOCCAL VAC POLYVALENT 25 MCG/0.5ML IJ INJ
0.5000 mL | INJECTION | INTRAMUSCULAR | Status: AC
  Administered 2016-02-03: 0.5 mL via INTRAMUSCULAR
  Filled 2016-02-02: qty 0.5

## 2016-02-02 MED ORDER — KETOROLAC TROMETHAMINE 30 MG/ML IJ SOLN
30.0000 mg | Freq: Once | INTRAMUSCULAR | Status: AC
  Administered 2016-02-02: 30 mg via INTRAVENOUS
  Filled 2016-02-02: qty 1

## 2016-02-02 MED ORDER — MORPHINE SULFATE (PF) 2 MG/ML IV SOLN
2.0000 mg | INTRAVENOUS | Status: DC | PRN
  Administered 2016-02-02: 4 mg via INTRAVENOUS
  Administered 2016-02-03 (×2): 2 mg via INTRAVENOUS
  Filled 2016-02-02 (×3): qty 1

## 2016-02-02 MED ORDER — ASPIRIN EC 325 MG PO TBEC
325.0000 mg | DELAYED_RELEASE_TABLET | Freq: Every day | ORAL | Status: DC
  Administered 2016-02-02 – 2016-02-03 (×2): 325 mg via ORAL
  Filled 2016-02-02 (×2): qty 1

## 2016-02-02 MED ORDER — ACETAMINOPHEN 650 MG RE SUPP: 650.0000 mg | Freq: Four times a day (QID) | RECTAL | Status: DC | PRN

## 2016-02-02 MED ORDER — TRIAMTERENE-HCTZ 37.5-25 MG PO TABS
1.0000 | ORAL_TABLET | Freq: Every day | ORAL | Status: DC
  Administered 2016-02-03: 1 via ORAL
  Filled 2016-02-02: qty 1

## 2016-02-02 MED ORDER — ONDANSETRON HCL 4 MG/2ML IJ SOLN
4.0000 mg | Freq: Four times a day (QID) | INTRAMUSCULAR | Status: DC | PRN
  Administered 2016-02-02: 4 mg via INTRAVENOUS
  Filled 2016-02-02: qty 2

## 2016-02-02 NOTE — ED Provider Notes (Addendum)
CSN: [id number]     Arrival date & time 02/02/16  1311 History   First MD Initiated Contact with Patient 02/02/16 1502     Chief Complaint  Patient presents with  . Chest Pain     (Consider location/radiation/quality/duration/timing/severity/associated sxs/prior Treatment) HPI...Marland KitchenMarland KitchenAnterior chest pain for 2 months worse this morning at 7:30 AM described as tightness with associated dyspnea, diaphoresis, nausea. Past medical history includes Barrett's esophagus, GERD, hypertension, cigarette smoking. No diabetes. Both father and mother had CAD in mid life. Pain radiates to shoulders.  Past Medical History  Diagnosis Date  . Hypertension   . Stomach ulcer   . Esophagus, Barrett's   . Anxiety   . GERD (gastroesophageal reflux disease)    Past Surgical History  Procedure Laterality Date  . Abdominal hysterectomy    . Cholecystectomy N/A 03/03/2015    Procedure: LAPAROSCOPIC CHOLECYSTECTOMY;  Surgeon: Franky Macho, MD;  Location: [facility];  Service: General;  Laterality: N/A;  . Hernia repair      umbilical  . Colonoscopy with propofol N/A 01/26/2016    Procedure: COLONOSCOPY WITH PROPOFOL;  Surgeon: Corbin Ade, MD;  [facility];  Service: Endoscopy;  Laterality: N/A;  815  . Esophagogastroduodenoscopy (egd) with propofol N/A 01/26/2016    Procedure: ESOPHAGOGASTRODUODENOSCOPY (EGD) WITH PROPOFOL;  Surgeon: Corbin Ade, MD;  [facility];  Service: Endoscopy;  Laterality: N/A;  Elease Hashimoto dilation N/A 01/26/2016    Procedure: Elease Hashimoto DILATION;  Surgeon: Corbin Ade, MD;  [facility];  Service: Endoscopy;  Laterality: N/A;  . Biopsy  01/26/2016    Procedure: BIOPSY;  Surgeon: Corbin Ade, MD;  [facility];  Service: Endoscopy;;  gastric  . Polypectomy  01/26/2016    Procedure: POLYPECTOMY;  Surgeon: Corbin Ade, MD;  [facility];  Service: Endoscopy;;  sigmoid colon  . Esophagogastroduodenoscopy  May 2014    [place],  Dr. Marva Panda: Grade D erosive esophagitis, Z-line irregular and biopsied, hiatal hernia, gastritis, duodenal biopsy negative for celiac. Biopsy comment from [facility] states "if characteristic lesion was identified endoscopically within the GE junction, the histologic findings would fulfill criteria for barrett's.    Family History  Problem Relation Age of Onset  . Colon cancer Neg Hx    Social History  Substance Use Topics  . Smoking status: Current Every Day Smoker -- 1.00 packs/day for 16 years    Types: Cigarettes  . Smokeless tobacco: None  . Alcohol Use: Yes     Comment: occ   OB History    No data available     Review of Systems  All other systems reviewed and are negative.     Allergies  Review of patient's allergies indicates no known allergies.  Home Medications   Prior to Admission medications   Medication Sig Start Date End Date Taking? Authorizing Provider  diazepam (VALIUM) 5 MG tablet Take one tablet by mouth once daily 01/02/16  Yes Historical Provider, MD  dicyclomine (BENTYL) 10 MG capsule Take 1 capsule (10 mg total) by mouth 4 (four) times daily -  before meals and at bedtime. 01/22/16  Yes Nira Retort, NP  escitalopram (LEXAPRO) 10 MG tablet Take one tablet by mouth once daily 01/02/16  Yes Historical Provider, MD  omeprazole (PRILOSEC) 40 MG capsule Take 40 mg by mouth daily. 01/29/16  Yes Historical Provider, MD  triamterene-hydrochlorothiazide (DYAZIDE) 37.5-25 MG capsule Take one tablet by mouth once daily 01/02/16  Yes Historical Provider,  MD  polyethylene glycol-electrolytes (TRILYTE) 420 g solution Take 4,000 mLs by mouth as directed. Patient not taking: Reported on 02/02/2016 01/22/16   Corbin Adeobert M Rourk, MD   BP 141/83 mmHg  Pulse 61  Temp(Src) 98 F (36.7 C) (Oral)  Resp 14  Ht 5\' 3"  (1.6 m)  Wt 160 lb (72.576 kg)  BMI 28.35 kg/m2  SpO2 97% Physical Exam  Constitutional: She is oriented to person, place, and time. She appears well-developed and  well-nourished.  HENT:  Head: Normocephalic and atraumatic.  Eyes: Conjunctivae and EOM are normal. Pupils are equal, round, and reactive to light.  Neck: Normal range of motion. Neck supple.  Cardiovascular: Normal rate and regular rhythm.   Pulmonary/Chest: Effort normal and breath sounds normal.  Abdominal: Soft. Bowel sounds are normal.  Musculoskeletal: Normal range of motion.  Neurological: She is alert and oriented to person, place, and time.  Skin: Skin is warm and dry.  Psychiatric: She has a normal mood and affect. Her behavior is normal.  Nursing note and vitals reviewed.   ED Course  Procedures (including critical care time) Labs Review Labs Reviewed  BASIC METABOLIC PANEL - Abnormal; Notable for the following:    Potassium 3.4 (*)    Glucose, Bld 101 (*)    All other components within normal limits  CBC - Abnormal; Notable for the following:    Hemoglobin 15.4 (*)    All other components within normal limits  TROPONIN I - Abnormal; Notable for the following:    Troponin I 0.05 (*)    All other components within normal limits  TROPONIN I    Imaging Review Dg Chest 2 View  02/02/2016  CLINICAL DATA:  Mid chest pain on off for 2-3 months. EXAM: CHEST  2 VIEW COMPARISON:  07/28/2014 FINDINGS: The heart size and mediastinal contours are within normal limits. Both lungs are clear. The visualized skeletal structures are unremarkable. IMPRESSION: No active cardiopulmonary disease. Electronically Signed   By: Charlett NoseKevin  Dover M.D.   On: 02/02/2016 13:38   I have personally reviewed and evaluated these images and lab results as part of my medical decision-making.   EKG Interpretation   Date/Time:  Monday February 02 2016 13:18:47 EDT Ventricular Rate:  75 PR Interval:  134 QRS Duration: 82 QT Interval:  410 QTC Calculation: 457 R Axis:   71 Text Interpretation:  Normal sinus rhythm Possible Left atrial enlargement  Nonspecific ST abnormality Abnormal ECG No significant  change was found  Confirmed by Manus GunningANCOUR  MD, STEPHEN [id number]) on 02/02/2016 3:02:49 PM Also  confirmed by Adriana SimasOOK  MD, Tylor Gambrill ([id number])  on 02/02/2016 4:21:57 PM      MDM   Final diagnoses:  Chest pain, unspecified chest pain type    Patient has reasonable history for anginal related pain. EKG normal. Chest x-ray negative. First troponin 0.05. Will consult cardiology and admit to general medicine.    Donnetta HutchingBrian Airyonna Franklyn, MD 02/02/16 1625  Donnetta HutchingBrian Trevar Boehringer, MD 02/02/16 [id number]

## 2016-02-02 NOTE — H&P (Signed)
[facility] History and Physical  Jocelyn Richard XBJ:[id number] DOB: [birth year] DOA: 02/02/2016  Referring physician: Dr. Adriana Simas , ED PCP: Lenise Herald, PA-C   Chief Complaint: Chest pain  HPI: Jocelyn Richard is a 50 y.o. female with history of HTN, tobacco use , depression and Barrett's esophagus presenting with 2- 23month history of worsening chest pain episodes.  Patient describes SSCP radiating into both arms with tingling of the arms, associated nausea lately and sweats also.  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  Not related to exertion.  Happens at rest.  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  Denies any hx of MI, stents.  +Fam hist of MI father in his 62's and mother a bit earlier.  Long term smoker.  Has hx Barrett's and says that just last week she had her esophagus "stretched". In the chart there is a note on 6/16 with results "Grade D erosive esophagitis, Z-line irregular and biopsied, hiatal hernia, gastritis, duodenal biopsy negative for celiac."  She takes daily Prilosec. Hx stomach ulcers as well per GI notes.    She within the last two weeks gained legal guardianship of two of her grandchildren, 24 and 6 yr old boys.  Her daughter-in-law passed away from drug OD, she was a drug addict.  Her son is addicted too she says.  She is under a lot of stress.  However her chest pain episodes predate these recent events.     ROS  no joint pain   no HA  no blurry vision  no rash  no diarrhea  no nausea/ vomiting  no dysuria  no difficulty voiding  no change in urine color    Past Medical History  Past Medical History  Diagnosis Date  . Hypertension   . Stomach ulcer   . Esophagus, Barrett's   . Anxiety   . GERD (gastroesophageal reflux disease)    Past Surgical History  Past Surgical History  Procedure Laterality Date  . Abdominal hysterectomy    . Cholecystectomy N/A  03/03/2015    Procedure: LAPAROSCOPIC CHOLECYSTECTOMY;  Surgeon: Franky Macho, MD;  Location: [facility];  Service: General;  Laterality: N/A;  . Hernia repair      umbilical  . Colonoscopy with propofol N/A 01/26/2016    Procedure: COLONOSCOPY WITH PROPOFOL;  Surgeon: Corbin Ade, MD;  [facility];  Service: Endoscopy;  Laterality: N/A;  815  . Esophagogastroduodenoscopy (egd) with propofol N/A 01/26/2016    Procedure: ESOPHAGOGASTRODUODENOSCOPY (EGD) WITH PROPOFOL;  Surgeon: Corbin Ade, MD;  [facility];  Service: Endoscopy;  Laterality: N/A;  Elease Hashimoto dilation N/A 01/26/2016    Procedure: Elease Hashimoto DILATION;  Surgeon: Corbin Ade, MD;  [facility];  Service: Endoscopy;  Laterality: N/A;  . Biopsy  01/26/2016    Procedure: BIOPSY;  Surgeon: Corbin Ade, MD;  [facility];  Service: Endoscopy;;  gastric  . Polypectomy  01/26/2016    Procedure: POLYPECTOMY;  Surgeon: Corbin Ade, MD;  [facility];  Service: Endoscopy;;  sigmoid colon  . Esophagogastroduodenoscopy  May 2014    [place], Dr. Marva Panda: Grade D erosive esophagitis, Z-line irregular and biopsied, hiatal hernia, gastritis, duodenal biopsy negative for celiac. Biopsy comment from [facility] states "if characteristic lesion was identified endoscopically within the GE junction, the histologic findings would fulfill  criteria for barrett's.    Family History  Family History  Problem Relation Age of Onset  . Colon cancer Neg Hx    Social History  reports that she has been smoking Cigarettes.  She has a 16 pack-year smoking history. She does not have any smokeless tobacco history on file. She reports that she drinks alcohol. She reports that she does not use illicit drugs. Allergies No Known Allergies Home medications Prior to Admission medications   Medication Sig Start Date End Date Taking? Authorizing Provider  diazepam (VALIUM) 5 MG tablet Take one tablet by mouth once daily  01/02/16  Yes Historical Provider, MD  dicyclomine (BENTYL) 10 MG capsule Take 1 capsule (10 mg total) by mouth 4 (four) times daily -  before meals and at bedtime. 01/22/16  Yes Nira RetortAnna W Sams, NP  escitalopram (LEXAPRO) 10 MG tablet Take one tablet by mouth once daily 01/02/16  Yes Historical Provider, MD  omeprazole (PRILOSEC) 40 MG capsule Take 40 mg by mouth daily. 01/29/16  Yes Historical Provider, MD  triamterene-hydrochlorothiazide (DYAZIDE) 37.5-25 MG capsule Take one tablet by mouth once daily 01/02/16  Yes Historical Provider, MD  polyethylene glycol-electrolytes (TRILYTE) 420 g solution Take 4,000 mLs by mouth as directed. Patient not taking: Reported on 02/02/2016 01/22/16   Corbin Adeobert M Rourk, MD   Liver Function Tests No results for input(s): AST, ALT, ALKPHOS, BILITOT, PROT, ALBUMIN in the last 168 hours. No results for input(s): LIPASE, AMYLASE in the last 168 hours. CBC  Recent Labs Lab 02/02/16 1342  WBC 9.2  HGB 15.4*  HCT 45.6  MCV 93.8  PLT 314   Basic Metabolic Panel  Recent Labs Lab 02/02/16 1342  NA 137  K 3.4*  CL 103  CO2 28  GLUCOSE 101*  BUN 13  CREATININE 0.72  CALCIUM 9.3     Filed Vitals:   02/02/16 1318 02/02/16 1602 02/02/16 1630 02/02/16 1900  BP: 146/96 131/85 141/83 122/83  Pulse: 81 60 61 55  Temp: 98 F (36.7 C) 98 F (36.7 C)    TempSrc:  Oral    Resp: 18 20 14 18   Height: 5\' 3"  (1.6 m)     Weight: 72.576 kg (160 lb)     SpO2: 96% 99% 97% 98%   Exam: Gen WDWN adult female, no distress No rash, cyanosis or gangrene Sclera anicteric, throat clear  No jvd or bruits Chest clear bilat RRR no MRG Abd soft ntnd no mass or ascites +bs GU defer MS no joint effusions or deformity Ext no LE edema / no wounds or ulcers Neuro is alert, Ox 3 , nf   EKG (independ reviewed) > NSR, normal EKG CXR (independ reviewed) > normal CXR  Normal chem and CBC   Assessment: 1  Chest pain - +risk factors for CAD are tobacco/ HTN/ fam hx.  Also  history of Barrett's just scoped last week.  Plan is admit , r/o MI, cardiology consulted by ED MD.  May need stress test as patient very worried about her heart, although pain is atypical.   2  Barrett's esophagus 3  Tobacco 4   HTN on triamterene/ HCT only  Plan - as above, R/O MI, cards consult, NPO after MN     Maree KrabbeSCHERTZ,Mehkai Gallo D [facility] Pager [phone]  Cell [phone]  If 7PM-7AM, please contact night-coverage [email or web address] Password [email or web address] 02/02/2016, 7:55 PM

## 2016-02-02 NOTE — Progress Notes (Signed)
2136 Patient c/o intermittent chest pain & is reporting that it is now worse. Pt had PRN Morphine 4mg  Q4H @ 2005 in the ED & is requesting something now for pain. Pt on telemetry box #24, running NSR at this time, Pulse 62. Mid-level notified.

## 2016-02-02 NOTE — ED Notes (Signed)
Patient complaining of chest pain radiating into bilateral shoulders since yesterday.

## 2016-02-02 NOTE — ED Notes (Signed)
States belching/drinking soda helps pain. Pain worse with lying down.

## 2016-02-03 ENCOUNTER — Other Ambulatory Visit: Payer: Self-pay | Admitting: Adult Health

## 2016-02-03 ENCOUNTER — Encounter (HOSPITAL_COMMUNITY): Payer: Self-pay | Admitting: Adult Health

## 2016-02-03 DIAGNOSIS — R079 Chest pain, unspecified: Secondary | ICD-10-CM

## 2016-02-03 DIAGNOSIS — K210 Gastro-esophageal reflux disease with esophagitis: Secondary | ICD-10-CM | POA: Diagnosis not present

## 2016-02-03 DIAGNOSIS — Z720 Tobacco use: Secondary | ICD-10-CM | POA: Diagnosis not present

## 2016-02-03 DIAGNOSIS — I10 Essential (primary) hypertension: Secondary | ICD-10-CM

## 2016-02-03 DIAGNOSIS — K2270 Barrett's esophagus without dysplasia: Secondary | ICD-10-CM | POA: Diagnosis not present

## 2016-02-03 LAB — TROPONIN I
TROPONIN I: 0.05 ng/mL — AB (ref <0.031)
TROPONIN I: 0.04 ng/mL — AB (ref <0.031)

## 2016-02-03 LAB — MAGNESIUM: MAGNESIUM: 2.1 mg/dL (ref 1.7–2.4)

## 2016-02-03 MED ORDER — OMEPRAZOLE 40 MG PO CPDR: 40.0000 mg | DELAYED_RELEASE_CAPSULE | Freq: Every day | ORAL | Status: DC

## 2016-02-03 MED ORDER — ASPIRIN EC 81 MG PO TBEC: 81.0000 mg | DELAYED_RELEASE_TABLET | Freq: Every day | ORAL | Status: AC

## 2016-02-03 MED ORDER — POTASSIUM CHLORIDE CRYS ER 20 MEQ PO TBCR
40.0000 meq | EXTENDED_RELEASE_TABLET | Freq: Once | ORAL | Status: AC
  Administered 2016-02-03: 40 meq via ORAL
  Filled 2016-02-03: qty 2

## 2016-02-03 MED ORDER — ALUMINUM-MAGNESIUM-SIMETHICONE 200-200-20 MG/5ML PO SUSP: 30.0000 mL | Freq: Three times a day (TID) | ORAL | Status: DC | PRN

## 2016-02-03 MED ORDER — NITROGLYCERIN 0.4 MG SL SUBL: 0.4000 mg | SUBLINGUAL_TABLET | SUBLINGUAL | Status: DC | PRN

## 2016-02-03 NOTE — Progress Notes (Signed)
Patient with orders to be discharge home. Discharge instructions given, patient verbalized understanding. Patient stable. Patient left in private vehicle with spouse.  

## 2016-02-03 NOTE — Consult Note (Signed)
CARDIOLOGY CONSULT NOTE   Patient ID: Jocelyn Richard MRN: [id number] DOB/AGE: Dec 10, 1965 50 y.o.  Admit Date: 02/02/2016 Referring Physician: [facility] Primary Physician: Lenise Herald, PA-C Consulting Cardiologist: Prentice Docker MD Primary Cardiologist: New Reason for Consultation: Recurrent chest pain  Clinical Summary Jocelyn Richard is a 50 y.o.female with no prior cardiac history, admitted with recurrent chest pain. She has history of Barrett's Esophagus, recent dilation, ongoing tobacco abuse, hypertension, and depression. Under a lot of family stressors with recent death of daughter-in-law due to drug overdose, son is drug addict. Having to raise her grandchildren.   States that chest discomfort has been going on for approximately 3 months, worsening in frequency. Feels like pressure from the inside, substernal, with pain across shoulders and neck, diaphoresis and nausea. 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 Due to symptoms, she walked into a freezer to cool off. When symptoms did not subside, she came to ER.   On arrival to ER, BP 146/96, HR 81, O2 Sat 96%, afebrile. Troponin 0.05 and 0.06 respectively,potassium 3.4, CBC unremarkable. CXR no active cardiopulmonary disease. EKG NSR with non-specific T-wave abnormalities. She was treated with morphine and Zofran and admitted to rule out ACS. Subsequent troponin levels have been minimally elevated but flat.   No Known Allergies  Medications Scheduled Medications: . aspirin EC  325 mg Oral Daily  . diazepam  5 mg Oral Daily  . dicyclomine  10 mg Oral TID AC & HS  . escitalopram  10 mg Oral Daily  . pantoprazole  80 mg Oral Daily  . pneumococcal 23 valent vaccine  0.5 mL Intramuscular Tomorrow-1000  . sodium chloride flush  3 mL Intravenous Q12H  . sodium chloride flush  3 mL Intravenous Q12H  . triamterene-hydrochlorothiazide  1 tablet Oral Daily    Infusions:    PRN  Medications: sodium chloride, acetaminophen **OR** acetaminophen, morphine injection, ondansetron (ZOFRAN) IV, sodium chloride flush, traMADol   Past Medical History  Diagnosis Date  . Hypertension   . Stomach ulcer   . Esophagus, Barrett's   . Anxiety   . GERD (gastroesophageal reflux disease)     Past Surgical History  Procedure Laterality Date  . Abdominal hysterectomy    . Cholecystectomy N/A 03/03/2015    Procedure: LAPAROSCOPIC CHOLECYSTECTOMY;  Surgeon: Franky Macho, MD;  Location: [facility];  Service: General;  Laterality: N/A;  . Hernia repair      umbilical  . Colonoscopy with propofol N/A 01/26/2016    Procedure: COLONOSCOPY WITH PROPOFOL;  Surgeon: Corbin Ade, MD;  [facility];  Service: Endoscopy;  Laterality: N/A;  815  . Esophagogastroduodenoscopy (egd) with propofol N/A 01/26/2016    Procedure: ESOPHAGOGASTRODUODENOSCOPY (EGD) WITH PROPOFOL;  Surgeon: Corbin Ade, MD;  [facility];  Service: Endoscopy;  Laterality: N/A;  Elease Hashimoto dilation N/A 01/26/2016    Procedure: Elease Hashimoto DILATION;  Surgeon: Corbin Ade, MD;  [facility];  Service: Endoscopy;  Laterality: N/A;  . Biopsy  01/26/2016    Procedure: BIOPSY;  Surgeon: Corbin Ade, MD;  [facility];  Service: Endoscopy;;  gastric  . Polypectomy  01/26/2016    Procedure: POLYPECTOMY;  Surgeon: Corbin Ade, MD;  [facility];  Service: Endoscopy;;  sigmoid colon  . Esophagogastroduodenoscopy  May 2014    [place], Dr. Marva Panda: Grade D erosive esophagitis, Z-line irregular and biopsied, hiatal hernia, gastritis, duodenal biopsy negative for celiac.  Biopsy comment from [facility] states "if characteristic lesion was identified endoscopically within the GE junction, the histologic findings would fulfill criteria for barrett's.     Family History  Problem Relation Age of Onset  . Colon cancer Neg Hx   . Heart attack Father   . Heart attack Mother     Social  History Jocelyn Richard reports that she has been smoking Cigarettes.  She has a 16 pack-year smoking history. She does not have any smokeless tobacco history on file. Jocelyn Richard reports that she drinks alcohol.  Review of Systems Complete review of systems are found to be negative unless outlined in H&P above.  Physical Examination Blood pressure 100/68, pulse 59, temperature 98 F (36.7 C), temperature source Oral, resp. rate 18, height  (1.6 m), weight 160 lb (72.576 kg), SpO2 95 %.  Intake/Output Summary (Last 24 hours) at 02/03/16 0856 Last data filed at 02/03/16 0810  Gross per 24 hour  Intake      0 ml  Output      0 ml  Net      0 ml    Telemetry: NSR  ZOX:[id number], but in no acute distress. HEENT: Conjunctiva and lids normal, oropharynx clear with moist mucosa. Neck: Supple, no elevated JVP or carotid bruits, no thyromegaly. Lungs: Clear to auscultation, nonlabored breathing at rest. Soreness with inspiration.  Cardiac: Regular rate and rhythm, no S3 or significant systolic murmur, no pericardial rub. Abdomen: Soft, nontender, no hepatomegaly, bowel sounds present, no guarding or rebound. Extremities: No pitting edema, distal pulses 2+. Skin: Warm and dry. Musculoskeletal: No kyphosis. Pain is reproducible with palpation of back and chest.  Neuropsychiatric: Alert and oriented x3, affect grossly appropriate.  Prior Cardiac Testing/Procedures None  Lab Results  Basic Metabolic Panel:  Recent Labs Lab 02/02/16 1342  NA 137  K 3.4*  CL 103  CO2 28  GLUCOSE 101*  BUN 13  CREATININE 0.72  CALCIUM 9.3    Liver Function Tests: No results for input(s): AST, ALT, ALKPHOS, BILITOT, PROT, ALBUMIN in the last 168 hours.  CBC:  Recent Labs Lab 02/02/16 1342  WBC 9.2  HGB 15.4*  HCT 45.6  MCV 93.8  PLT 314    Cardiac Enzymes:  Recent Labs Lab 02/02/16 1342 02/02/16 1831 02/02/16 2314 02/03/16 0542  TROPONINI 0.05* 0.06* 0.05* 0.04*     BNP: Invalid input(s): POCBNP   Radiology: Dg Chest 2 View  02/02/2016  CLINICAL DATA:  Mid chest pain on off for 2-3 months. EXAM: CHEST  2 VIEW COMPARISON:  07/28/2014 FINDINGS: The heart size and mediastinal contours are within normal limits. Both lungs are clear. The visualized skeletal structures are unremarkable. IMPRESSION: No active cardiopulmonary disease. Electronically Signed   By: Charlett Nose M.D.   On: 02/02/2016 13:38     ECG NSR with non-specific ST abnormalities.    Impression and Recommendations  1. Chest Pain: Multifactorial. Potassium is low normal. Will replete. Will check Mg as she is on PPI. 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 CVRF of Family hx, hypertension, tobacco abuse. Unknown cholesterol status. She will need OP stress test as troponin is negative and EKG argues against ACS. She does not wish to stay in the hospital to have test. Due to hypotension, she will not be placed on nitrates. Hypotension may be related to pain control with morphine.   We will plan OP stress test and she her in our office as follow up. Scheduled for Friday,  June 30th at 9:30 with follow up appointment on February 20, 2016. This will be added to discharge follow up.   2. GERD with Esophageal dilation: She is on PPI. She will be checked for hypomagnesemia.   3. Ongoing tobacco abuse: Significant risk factor. She is advised to stop smoking.   Signed: Bettey MareKathryn M. Lawrence NP AACC  02/03/2016, 8:56 AM Co-Sign MD  The patient was seen and examined, and I agree with the history, physical exam, assessment and plan as documented and which has been discussed with Harriet PhoK. Lawrence NP, with modifications as noted below. Pt admitted with chest pain and minimal troponin elevation. Has had chest pain on/off for past 2-3 months both at rest and with exertion. Yesterday's episode occurred while at work and was accompanied by nausea, diaphoresis, and neck numbness. Also has significant  anxiety/stress at home. Has erosive esophagitis, hypertension, and tobacco abuse.  ECG shows minimal ST segment abnormalities. Chest xray normal.  Would recommend stress testing, perhaps exercise Myoview. Today's schedule is full so this has been arranged to be done as an outpatient later this week. Low normal BP and HR preclude addition of nitrates and/or beta blockers.  Grandson's birthday is today and she is eager to go home if stress testing cannot be done today.  Prentice DockerSuresh Koneswaran, MD, [facility]  02/03/2016 8:40 AM

## 2016-02-03 NOTE — Consult Note (Signed)
The patient was seen and examined, and I agree with the history, physical exam, assessment and plan as documented and which has been discussed with Harriet PhoK. Lawrence NP, with modifications as noted below. Pt admitted with chest pain and minimal troponin elevation. Has had chest pain on/off for past 2-3 months both at rest and with exertion. Yesterday's episode occurred while at work and was accompanied by nausea, diaphoresis, and neck numbness. Also has significant anxiety/stress at home. Has erosive esophagitis, hypertension, and tobacco abuse.  ECG shows minimal ST segment abnormalities. Chest xray normal.  Would recommend stress testing, perhaps exercise Myoview. Today's schedule may be full so this may have to be arranged to be done as an outpatient later this week. Low normal BP and HR preclude addition of nitrates and/or beta blockers.  Grandson's birthday is today and she is eager to go home if stress testing cannot be done today.  Prentice DockerSuresh Dalina Samara, MD, [facility] HFACC  02/03/2016 8:40 AM

## 2016-02-03 NOTE — Discharge Summary (Signed)
Physician Discharge Summary  Jocelyn Richard PPJ:[id number] DOB: 1966-05-03 DOA: 02/02/2016  PCP: Lenise Herald, PA-C  Admit date: 02/02/2016 Discharge date: 02/03/2016  Time spent: 35 minutes  Recommendations for Outpatient Follow-up:  1. Follow up with PCP in 1-2 weeks 2. Follow up for outpatient stress tests  As scheduled    Discharge Diagnoses:  Principal Problem:   Chest pain Active Problems:   GERD (gastroesophageal reflux disease)   Barrett esophagus   Tobacco abuse   Essential hypertension   Chest pain, rule out acute myocardial infarction   Discharge Condition: improved  Diet recommendation: heart healthy  Filed Weights   02/02/16 1318  Weight: 72.576 kg (160 lb)    History of present illness:  72 yof with a hx of HTN, Barrett's esophagus, tobacco use, and depression, presented with complaints of 2-3 months of worsening chest pain episodes which she described as radiating to both upper extremities. She also described tingling sensation in her arms, associated nausea, and diaphoresis. She stated that the pain is unrelated to exertion, and occurs at rest as well. She reported that on the day of admission, she felt so uncomfortable and hot, that she had to go into the meat freezer at work. She works for a [employer] and must frequently lift heavy objects. She denies any history of MI, or stents, though she has a family history of MI. She has a history of Barrett's esophagus and stomach ulcers as well.  Additionally, she reported that within the last two weeks, she gained guardianship of her grandchildren after her daughter-in-law died from a drug OD. She reported being under plenty of stress, however, her chest pain episodes predate the recent stressful events.  Hospital Course:  26 yof presented with complaints of chest pain. While being evaluated in the ED, CXR was negative for any acute cardiopulmonary disease. Her troponin was noted to be minimally elevated at 0.05,  but remained flat. Cardiology was consulted who felt that her chest pain was likely multifactorial, possibly related to low potassium and magnesium abnormalities. Additionally, it was felt that she would benefit from an outpatient stress test since she did not display any signs for ACS and her troponin was flat. She also describes significant symptoms of GERD and reported that her chest pain had become worse when she had changed from omeprazole to dexilant. She has been advised to switch back to omeprazole. She will need to follow up for an outpatient stress on June 30th with a follow up appointment on July 7th.   Procedures:  none  Consultations:  Cardiology  Discharge Exam: Filed Vitals:   02/02/16 2111 02/03/16 0452  BP: 136/84 100/68  Pulse: 52 59  Temp: 97.6 F (36.4 C) 98 F (36.7 C)  Resp: 16 18    Examination:  General exam: Appears calm and comfortable  Respiratory system: Clear to auscultation. Respiratory effort normal. Cardiovascular system: S1 & S2 heard, RRR. No JVD, murmurs, rubs, gallops or clicks. No pedal edema. Gastrointestinal system: Abdomen is nondistended, soft and nontender. No organomegaly or masses felt. Normal bowel sounds heard. Central nervous system: Alert and oriented. No focal neurological deficits. Extremities: Symmetric 5 x 5 power. Skin: No rashes, lesions or ulcers Psychiatry: Judgement and insight appear normal. Mood & affect appropriate.   Discharge Instructions   Discharge Instructions    Diet - low sodium heart healthy    Complete by:  As directed      Increase activity slowly    Complete by:  As  directed           Discharge Medication List as of 02/03/2016 11:11 AM    START taking these medications   Details  aluminum-magnesium hydroxide-simethicone (MAALOX) 200-200-20 MG/5ML SUSP Take 30 mLs by mouth 3 (three) times daily as needed., Starting 02/03/2016, Until Discontinued, OTC    aspirin EC 81 MG tablet Take 1 tablet (81 mg  total) by mouth daily., Starting 02/03/2016, Until Discontinued, Print    nitroGLYCERIN (NITROSTAT) 0.4 MG SL tablet Place 1 tablet (0.4 mg total) under the tongue every 5 (five) minutes as needed for chest pain., Starting 02/03/2016, Until Discontinued, Print      CONTINUE these medications which have CHANGED   Details  omeprazole (PRILOSEC) 40 MG capsule Take 1 capsule (40 mg total) by mouth daily., Starting 02/03/2016, Until Discontinued, Print      CONTINUE these medications which have NOT CHANGED   Details  diazepam (VALIUM) 5 MG tablet Take one tablet by mouth once daily, Historical Med    dicyclomine (BENTYL) 10 MG capsule Take 1 capsule (10 mg total) by mouth 4 (four) times daily -  before meals and at bedtime., Starting 01/22/2016, Until Discontinued, Normal    escitalopram (LEXAPRO) 10 MG tablet Take one tablet by mouth once daily, Historical Med    triamterene-hydrochlorothiazide (DYAZIDE) 37.5-25 MG capsule Take one tablet by mouth once daily, Historical Med      STOP taking these medications     polyethylene glycol-electrolytes (TRILYTE) 420 g solution        No Known Allergies Follow-up Information    Follow up with [facility] On 02/13/2016.   Why:  9:30 to register for stress test. NPO night before.       Follow up with Joni Reining, NP On 02/20/2016.   Specialties:  Nurse Practitioner, Radiology, Cardiology   Why:  1:30 pm Hospital follow up   Contact information:   [address] [place] [address] [phone]       Follow up with MANN, BENJAMIN, PA-C In 2 weeks.   Specialties:  Physician Assistant, Internal Medicine   Contact information:   [address] [place] [address] [phone]        The results of significant diagnostics from this hospitalization (including imaging, microbiology, ancillary and laboratory) are listed below for reference.    Significant Diagnostic Studies: Dg Chest 2 View  02/02/2016  CLINICAL DATA:  Mid  chest pain on off for 2-3 months. EXAM: CHEST  2 VIEW COMPARISON:  07/28/2014 FINDINGS: The heart size and mediastinal contours are within normal limits. Both lungs are clear. The visualized skeletal structures are unremarkable. IMPRESSION: No active cardiopulmonary disease. Electronically Signed   By: Charlett Nose M.D.   On: 02/02/2016 13:38    Microbiology: No results found for this or any previous visit (from the past 240 hour(s)).   Labs: Basic Metabolic Panel:  Recent Labs Lab 02/02/16 1342 02/03/16 0542  NA 137  --   K 3.4*  --   CL 103  --   CO2 28  --   GLUCOSE 101*  --   BUN 13  --   CREATININE 0.72  --   CALCIUM 9.3  --   MG  --  2.1   CBC:  Recent Labs Lab 02/02/16 1342  WBC 9.2  HGB 15.4*  HCT 45.6  MCV 93.8  PLT 314   Cardiac Enzymes:  Recent Labs Lab 02/02/16 1342 02/02/16 1831 02/02/16 2314 02/03/16 0542  TROPONINI 0.05*  0.06* 0.05* 0.04*   Signed: Erick BlinksJehanzeb Mossie Gilder, MD.  [facility] 02/03/2016, 6:36 PM

## 2016-02-05 ENCOUNTER — Telehealth: Payer: Self-pay | Admitting: Gastroenterology

## 2016-02-05 NOTE — Telephone Encounter (Signed)
Tobi Bastosnna ordered this, can someone take a look at this please.

## 2016-02-05 NOTE — Telephone Encounter (Signed)
Forwarding to [place], Dr. Jena Gaussourk pt.

## 2016-02-05 NOTE — Telephone Encounter (Signed)
Pt called to see if her lab results were available. Please call [phone]

## 2016-02-06 NOTE — Telephone Encounter (Signed)
IGA level was normal. Further recommendations per Tobi BastosAnna when she returns.

## 2016-02-11 NOTE — Telephone Encounter (Signed)
Jocelyn Bastosnna, do you have any further recommendations?

## 2016-02-12 NOTE — Telephone Encounter (Signed)
No further recommendations. I will see her in August.

## 2016-02-13 ENCOUNTER — Inpatient Hospital Stay (HOSPITAL_COMMUNITY): Admit: 2016-02-13 | Payer: BLUE CROSS/BLUE SHIELD

## 2016-02-13 ENCOUNTER — Encounter (HOSPITAL_COMMUNITY): Payer: Self-pay | Admitting: General Practice

## 2016-02-13 ENCOUNTER — Other Ambulatory Visit (HOSPITAL_COMMUNITY): Payer: Self-pay

## 2016-02-13 ENCOUNTER — Ambulatory Visit (HOSPITAL_COMMUNITY): Admit: 2016-02-13 | Payer: Self-pay | Admitting: Cardiology

## 2016-02-13 ENCOUNTER — Other Ambulatory Visit: Payer: Self-pay | Admitting: Adult Health

## 2016-02-13 ENCOUNTER — Encounter (HOSPITAL_COMMUNITY)
Admission: RE | Admit: 2016-02-13 | Discharge: 2016-02-13 | Disposition: A | Discharge status: Home / Self Care | Payer: BLUE CROSS/BLUE SHIELD | Source: Ambulatory Visit | Attending: Adult Health | Admitting: Adult Health

## 2016-02-13 ENCOUNTER — Other Ambulatory Visit: Payer: Self-pay

## 2016-02-13 ENCOUNTER — Inpatient Hospital Stay (HOSPITAL_COMMUNITY)
Admission: EM | Admit: 2016-02-13 | Discharge: 2016-02-15 | DRG: 247 | Disposition: A | Discharge status: Home / Self Care | Payer: BLUE CROSS/BLUE SHIELD | Attending: Internal Medicine | Admitting: Internal Medicine

## 2016-02-13 ENCOUNTER — Encounter (HOSPITAL_COMMUNITY): Payer: Self-pay

## 2016-02-13 ENCOUNTER — Encounter (HOSPITAL_COMMUNITY)
Admission: EM | Disposition: A | Discharge status: Home / Self Care | Payer: Self-pay | Source: Home / Self Care | Attending: Internal Medicine

## 2016-02-13 DIAGNOSIS — Z9861 Coronary angioplasty status: Secondary | ICD-10-CM

## 2016-02-13 DIAGNOSIS — E876 Hypokalemia: Secondary | ICD-10-CM

## 2016-02-13 DIAGNOSIS — Z7982 Long term (current) use of aspirin: Secondary | ICD-10-CM

## 2016-02-13 DIAGNOSIS — I25110 Atherosclerotic heart disease of native coronary artery with unstable angina pectoris: Secondary | ICD-10-CM | POA: Insufficient documentation

## 2016-02-13 DIAGNOSIS — R931 Abnormal findings on diagnostic imaging of heart and coronary circulation: Secondary | ICD-10-CM | POA: Diagnosis not present

## 2016-02-13 DIAGNOSIS — F17210 Nicotine dependence, cigarettes, uncomplicated: Secondary | ICD-10-CM | POA: Diagnosis present

## 2016-02-13 DIAGNOSIS — Z79899 Other long term (current) drug therapy: Secondary | ICD-10-CM | POA: Diagnosis not present

## 2016-02-13 DIAGNOSIS — R079 Chest pain, unspecified: Secondary | ICD-10-CM | POA: Insufficient documentation

## 2016-02-13 DIAGNOSIS — I249 Acute ischemic heart disease, unspecified: Secondary | ICD-10-CM | POA: Diagnosis present

## 2016-02-13 DIAGNOSIS — I2510 Atherosclerotic heart disease of native coronary artery without angina pectoris: Secondary | ICD-10-CM

## 2016-02-13 DIAGNOSIS — Z720 Tobacco use: Secondary | ICD-10-CM | POA: Diagnosis not present

## 2016-02-13 DIAGNOSIS — E039 Hypothyroidism, unspecified: Secondary | ICD-10-CM | POA: Diagnosis present

## 2016-02-13 DIAGNOSIS — I200 Unstable angina: Secondary | ICD-10-CM

## 2016-02-13 DIAGNOSIS — E785 Hyperlipidemia, unspecified: Secondary | ICD-10-CM

## 2016-02-13 DIAGNOSIS — Z955 Presence of coronary angioplasty implant and graft: Secondary | ICD-10-CM

## 2016-02-13 DIAGNOSIS — I25119 Atherosclerotic heart disease of native coronary artery with unspecified angina pectoris: Secondary | ICD-10-CM | POA: Diagnosis not present

## 2016-02-13 DIAGNOSIS — K2270 Barrett's esophagus without dysplasia: Secondary | ICD-10-CM | POA: Diagnosis present

## 2016-02-13 DIAGNOSIS — I119 Hypertensive heart disease without heart failure: Secondary | ICD-10-CM

## 2016-02-13 DIAGNOSIS — I10 Essential (primary) hypertension: Secondary | ICD-10-CM

## 2016-02-13 HISTORY — DX: Personal history of other diseases of the digestive system: Z87.19

## 2016-02-13 HISTORY — DX: Hyperlipidemia, unspecified: E78.5

## 2016-02-13 HISTORY — DX: Hypertensive heart disease without heart failure: I11.9

## 2016-02-13 HISTORY — DX: Atherosclerotic heart disease of native coronary artery without angina pectoris: I25.10

## 2016-02-13 HISTORY — PX: CARDIAC CATHETERIZATION: SHX172

## 2016-02-13 HISTORY — DX: Tobacco use: Z72.0

## 2016-02-13 HISTORY — DX: Unspecified osteoarthritis, unspecified site: M19.90

## 2016-02-13 LAB — CBC
HEMATOCRIT: 41.4 % (ref 36.0–46.0)
Hemoglobin: 13.4 g/dL (ref 12.0–15.0)
MCH: 30.7 pg (ref 26.0–34.0)
MCHC: 32.4 g/dL (ref 30.0–36.0)
MCV: 94.7 fL (ref 78.0–100.0)
Platelets: 285 10*3/uL (ref 150–400)
RBC: 4.37 MIL/uL (ref 3.87–5.11)
RDW: 13.7 % (ref 11.5–15.5)
WBC: 8.7 10*3/uL (ref 4.0–10.5)

## 2016-02-13 LAB — NM MYOCAR MULTI W/SPECT W/WALL MOTION / EF
CSEPED: 6 min
CSEPEDS: 57 s
CSEPEW: 9.9 METS
MPHR: 172 {beats}/min
Peak HR: 139 {beats}/min
Percent HR: 80 %
Rest HR: 58 {beats}/min

## 2016-02-13 LAB — CREATININE, SERUM
Creatinine, Ser: 0.76 mg/dL (ref 0.44–1.00)
GFR calc non Af Amer: >60 mL/min (ref >60)

## 2016-02-13 LAB — I-STAT TROPONIN, ED: Troponin i, poc: 0 ng/mL (ref 0.00–0.08)

## 2016-02-13 LAB — TROPONIN I: Troponin I: <0.03 ng/mL (ref <0.03)

## 2016-02-13 LAB — POCT ACTIVATED CLOTTING TIME: ACTIVATED CLOTTING TIME: 466 s

## 2016-02-13 SURGERY — LEFT HEART CATH AND CORONARY ANGIOGRAPHY
Anesthesia: LOCAL

## 2016-02-13 MED ORDER — TRIAMTERENE-HCTZ 37.5-25 MG PO CAPS
1.0000 | ORAL_CAPSULE | Freq: Every day | ORAL | Status: DC
  Filled 2016-02-13 (×2): qty 1

## 2016-02-13 MED ORDER — SODIUM CHLORIDE 0.9 % WEIGHT BASED INFUSION: 1.0000 mL/kg/h | INTRAVENOUS | Status: AC

## 2016-02-13 MED ORDER — LIDOCAINE HCL (PF) 1 % IJ SOLN
INTRAMUSCULAR | Status: DC | PRN
  Administered 2016-02-13: 28 mL via SUBCUTANEOUS

## 2016-02-13 MED ORDER — HEPARIN BOLUS VIA INFUSION
60.0000 [IU]/kg | Freq: Once | INTRAVENOUS | Status: AC
  Administered 2016-02-13: 4356 [IU] via INTRAVENOUS

## 2016-02-13 MED ORDER — NITROGLYCERIN 0.4 MG SL SUBL: 0.4000 mg | SUBLINGUAL_TABLET | SUBLINGUAL | Status: DC | PRN

## 2016-02-13 MED ORDER — BIVALIRUDIN 250 MG IV SOLR
INTRAVENOUS | Status: AC
Start: 2016-02-13 — End: 2016-02-13
  Filled 2016-02-13: qty 250

## 2016-02-13 MED ORDER — ALUM & MAG HYDROXIDE-SIMETH 200-200-20 MG/5ML PO SUSP
30.0000 mL | Freq: Three times a day (TID) | ORAL | Status: DC | PRN
  Administered 2016-02-14: 30 mL via ORAL
  Filled 2016-02-13: qty 30

## 2016-02-13 MED ORDER — HEPARIN SODIUM (PORCINE) 5000 UNIT/ML IJ SOLN
5000.0000 [IU] | Freq: Three times a day (TID) | INTRAMUSCULAR | Status: DC
  Administered 2016-02-13 – 2016-02-15 (×5): 5000 [IU] via SUBCUTANEOUS
  Filled 2016-02-13 (×5): qty 1

## 2016-02-13 MED ORDER — NITROGLYCERIN IN D5W 200-5 MCG/ML-% IV SOLN
INTRAVENOUS | Status: DC | PRN
  Administered 2016-02-13: 10 ug/min via INTRAVENOUS

## 2016-02-13 MED ORDER — ONDANSETRON HCL 4 MG/2ML IJ SOLN: 4.0000 mg | Freq: Four times a day (QID) | INTRAMUSCULAR | Status: DC | PRN

## 2016-02-13 MED ORDER — SODIUM CHLORIDE 0.9 % IV SOLN
Freq: Once | INTRAVENOUS | Status: AC
  Administered 2016-02-13: 250 mL via INTRAVENOUS

## 2016-02-13 MED ORDER — VERAPAMIL HCL 2.5 MG/ML IV SOLN
INTRAVENOUS | Status: AC
  Filled 2016-02-13: qty 2

## 2016-02-13 MED ORDER — FENTANYL CITRATE (PF) 100 MCG/2ML IJ SOLN
INTRAMUSCULAR | Status: DC | PRN
  Administered 2016-02-13 (×2): 25 ug via INTRAVENOUS

## 2016-02-13 MED ORDER — BIVALIRUDIN 250 MG IV SOLR
INTRAVENOUS | Status: AC
  Filled 2016-02-13: qty 250

## 2016-02-13 MED ORDER — ASPIRIN EC 81 MG PO TBEC: 81.0000 mg | DELAYED_RELEASE_TABLET | Freq: Every day | ORAL | Status: DC

## 2016-02-13 MED ORDER — HEPARIN (PORCINE) IN NACL 2-0.9 UNIT/ML-% IJ SOLN
INTRAMUSCULAR | Status: DC | PRN
  Administered 2016-02-13: 1500 mL

## 2016-02-13 MED ORDER — SODIUM CHLORIDE 0.9% FLUSH: 3.0000 mL | INTRAVENOUS | Status: DC | PRN

## 2016-02-13 MED ORDER — SODIUM CHLORIDE 0.9% FLUSH
3.0000 mL | Freq: Two times a day (BID) | INTRAVENOUS | Status: DC
  Administered 2016-02-14 – 2016-02-15 (×3): 3 mL via INTRAVENOUS

## 2016-02-13 MED ORDER — ANGIOPLASTY BOOK
Freq: Once | Status: AC
  Administered 2016-02-13: 22:00:00
  Filled 2016-02-13: qty 1

## 2016-02-13 MED ORDER — TECHNETIUM TC 99M TETROFOSMIN IV KIT
10.0000 | PACK | Freq: Once | INTRAVENOUS | Status: AC | PRN
  Administered 2016-02-13: 10 via INTRAVENOUS

## 2016-02-13 MED ORDER — CLOPIDOGREL BISULFATE 75 MG PO TABS
75.0000 mg | ORAL_TABLET | Freq: Every day | ORAL | Status: DC
  Administered 2016-02-14 – 2016-02-15 (×2): 75 mg via ORAL
  Filled 2016-02-13 (×2): qty 1

## 2016-02-13 MED ORDER — CLOPIDOGREL BISULFATE 300 MG PO TABS
ORAL_TABLET | ORAL | Status: DC | PRN
  Administered 2016-02-13: 600 mg via ORAL

## 2016-02-13 MED ORDER — NITROGLYCERIN 0.4 MG SL SUBL
SUBLINGUAL_TABLET | SUBLINGUAL | Status: AC
  Administered 2016-02-13: 0.4 mg via SUBLINGUAL
  Filled 2016-02-13: qty 1

## 2016-02-13 MED ORDER — ASPIRIN 81 MG PO CHEW
CHEWABLE_TABLET | ORAL | Status: AC
  Administered 2016-02-13: 81 mg
  Filled 2016-02-13: qty 1

## 2016-02-13 MED ORDER — TRAMADOL HCL 50 MG PO TABS: 100.0000 mg | ORAL_TABLET | Freq: Four times a day (QID) | ORAL | Status: DC | PRN

## 2016-02-13 MED ORDER — VERAPAMIL HCL 2.5 MG/ML IV SOLN
INTRAVENOUS | Status: DC | PRN
  Administered 2016-02-13: 12:00:00 via INTRA_ARTERIAL

## 2016-02-13 MED ORDER — PANTOPRAZOLE SODIUM 40 MG PO TBEC
40.0000 mg | DELAYED_RELEASE_TABLET | Freq: Every day | ORAL | Status: DC
Start: 2016-02-13 — End: 2016-02-15
  Administered 2016-02-13 – 2016-02-15 (×3): 40 mg via ORAL
  Filled 2016-02-13 (×3): qty 1

## 2016-02-13 MED ORDER — DICYCLOMINE HCL 10 MG PO CAPS
10.0000 mg | ORAL_CAPSULE | Freq: Three times a day (TID) | ORAL | Status: DC
  Administered 2016-02-13 – 2016-02-15 (×7): 10 mg via ORAL
  Filled 2016-02-13 (×7): qty 1

## 2016-02-13 MED ORDER — HEPARIN (PORCINE) IN NACL 2-0.9 UNIT/ML-% IJ SOLN
INTRAMUSCULAR | Status: AC
  Filled 2016-02-13: qty 1500

## 2016-02-13 MED ORDER — ASPIRIN 81 MG PO CHEW: 324.0000 mg | CHEWABLE_TABLET | ORAL | Status: AC

## 2016-02-13 MED ORDER — SODIUM CHLORIDE 0.9 % WEIGHT BASED INFUSION
1.0000 mL/kg/h | INTRAVENOUS | Status: DC
  Administered 2016-02-13: 14:00:00 1 mL/kg/h via INTRAVENOUS

## 2016-02-13 MED ORDER — NITROGLYCERIN IN D5W 200-5 MCG/ML-% IV SOLN
INTRAVENOUS | Status: AC
  Administered 2016-02-13: 11:00:00 via INTRAVENOUS
  Filled 2016-02-13: qty 250

## 2016-02-13 MED ORDER — ACETAMINOPHEN 325 MG PO TABS: 650.0000 mg | ORAL_TABLET | ORAL | Status: DC | PRN

## 2016-02-13 MED ORDER — NITROGLYCERIN 1 MG/10 ML FOR IR/CATH LAB
INTRA_ARTERIAL | Status: DC | PRN
  Administered 2016-02-13 (×2): 200 ug via INTRA_ARTERIAL
  Administered 2016-02-13: 200 ug via INTRACORONARY

## 2016-02-13 MED ORDER — FENTANYL CITRATE (PF) 100 MCG/2ML IJ SOLN
INTRAMUSCULAR | Status: AC
  Filled 2016-02-13: qty 2

## 2016-02-13 MED ORDER — ATORVASTATIN CALCIUM 80 MG PO TABS
80.0000 mg | ORAL_TABLET | Freq: Every day | ORAL | Status: DC
  Administered 2016-02-13 – 2016-02-14 (×2): 80 mg via ORAL
  Filled 2016-02-13 (×3): qty 1

## 2016-02-13 MED ORDER — IOPAMIDOL (ISOVUE-370) INJECTION 76%
INTRAVENOUS | Status: DC | PRN
  Administered 2016-02-13: 90 mL via INTRA_ARTERIAL

## 2016-02-13 MED ORDER — HEPARIN (PORCINE) IN NACL 100-0.45 UNIT/ML-% IJ SOLN
INTRAMUSCULAR | Status: AC
  Administered 2016-02-13: 11:00:00
  Filled 2016-02-13: qty 250

## 2016-02-13 MED ORDER — DIAZEPAM 5 MG PO TABS
5.0000 mg | ORAL_TABLET | Freq: Every day | ORAL | Status: DC
  Administered 2016-02-13 – 2016-02-15 (×3): 5 mg via ORAL
  Filled 2016-02-13 (×3): qty 1

## 2016-02-13 MED ORDER — HEPARIN SODIUM (PORCINE) 1000 UNIT/ML IJ SOLN
INTRAMUSCULAR | Status: AC
  Filled 2016-02-13: qty 1

## 2016-02-13 MED ORDER — ASPIRIN 300 MG RE SUPP: 300.0000 mg | RECTAL | Status: AC

## 2016-02-13 MED ORDER — METOPROLOL TARTRATE 12.5 MG HALF TABLET
12.5000 mg | ORAL_TABLET | Freq: Two times a day (BID) | ORAL | Status: DC
  Administered 2016-02-13 – 2016-02-15 (×3): 12.5 mg via ORAL
  Filled 2016-02-13 (×3): qty 1

## 2016-02-13 MED ORDER — MORPHINE SULFATE (PF) 2 MG/ML IV SOLN
INTRAVENOUS | Status: AC
  Administered 2016-02-13: 2 mg via INTRAVENOUS
  Filled 2016-02-13: qty 1

## 2016-02-13 MED ORDER — MIDAZOLAM HCL 2 MG/2ML IJ SOLN
INTRAMUSCULAR | Status: AC
  Filled 2016-02-13: qty 2

## 2016-02-13 MED ORDER — REGADENOSON 0.4 MG/5ML IV SOLN
INTRAVENOUS | Status: AC
  Administered 2016-02-13: 0.4 mg via INTRAVENOUS
  Filled 2016-02-13: qty 5

## 2016-02-13 MED ORDER — NICOTINE 21 MG/24HR TD PT24
21.0000 mg | MEDICATED_PATCH | TRANSDERMAL | Status: DC
  Administered 2016-02-13 – 2016-02-14 (×2): 21 mg via TRANSDERMAL
  Filled 2016-02-13 (×2): qty 1

## 2016-02-13 MED ORDER — BIVALIRUDIN 250 MG IV SOLR
1.7500 mg/kg/h | INTRAVENOUS | Status: AC
  Filled 2016-02-13: qty 250

## 2016-02-13 MED ORDER — SODIUM CHLORIDE 0.9 % IV SOLN
INTRAVENOUS | Status: DC | PRN
  Administered 2016-02-13: 250 mL
  Administered 2016-02-13: 68 mL/h via INTRAVENOUS

## 2016-02-13 MED ORDER — SODIUM CHLORIDE 0.9 % IV SOLN: 250.0000 mL | INTRAVENOUS | Status: DC | PRN

## 2016-02-13 MED ORDER — MIDAZOLAM HCL 2 MG/2ML IJ SOLN
INTRAMUSCULAR | Status: DC | PRN
  Administered 2016-02-13: 2 mg via INTRAVENOUS
  Administered 2016-02-13: 1 mg via INTRAVENOUS

## 2016-02-13 MED ORDER — NITROGLYCERIN 1 MG/10 ML FOR IR/CATH LAB
INTRA_ARTERIAL | Status: AC
Start: 2016-02-13 — End: 2016-02-13
  Filled 2016-02-13: qty 10

## 2016-02-13 MED ORDER — SODIUM CHLORIDE 0.9% FLUSH
INTRAVENOUS | Status: AC
  Administered 2016-02-13: 10 mL via INTRAVENOUS
  Filled 2016-02-13: qty 10

## 2016-02-13 MED ORDER — TECHNETIUM TC 99M TETROFOSMIN IV KIT
30.0000 | PACK | Freq: Once | INTRAVENOUS | Status: AC | PRN
  Administered 2016-02-13: 30 via INTRAVENOUS

## 2016-02-13 MED ORDER — HEPARIN (PORCINE) IN NACL 100-0.45 UNIT/ML-% IJ SOLN
12.0000 [IU]/kg/h | Freq: Once | INTRAMUSCULAR | Status: AC
  Administered 2016-02-13: 12 [IU]/kg/h via INTRAVENOUS

## 2016-02-13 MED ORDER — ENSURE ENLIVE PO LIQD
237.0000 mL | Freq: Two times a day (BID) | ORAL | Status: DC
Start: 2016-02-13 — End: 2016-02-15
  Administered 2016-02-14 (×2): 237 mL via ORAL
  Filled 2016-02-13 (×5): qty 237

## 2016-02-13 MED ORDER — ESCITALOPRAM OXALATE 10 MG PO TABS
10.0000 mg | ORAL_TABLET | Freq: Every day | ORAL | Status: DC
  Administered 2016-02-14 – 2016-02-15 (×2): 10 mg via ORAL
  Filled 2016-02-13 (×3): qty 1

## 2016-02-13 MED ORDER — IOPAMIDOL (ISOVUE-370) INJECTION 76%
INTRAVENOUS | Status: AC
  Filled 2016-02-13: qty 100

## 2016-02-13 MED ORDER — TRAMADOL HCL 50 MG PO TABS
50.0000 mg | ORAL_TABLET | Freq: Four times a day (QID) | ORAL | Status: AC | PRN
  Administered 2016-02-13: 22:00:00 50 mg via ORAL
  Administered 2016-02-14: 100 mg via ORAL
  Filled 2016-02-13: qty 1
  Filled 2016-02-13: qty 2

## 2016-02-13 MED ORDER — ASPIRIN 81 MG PO CHEW
CHEWABLE_TABLET | ORAL | Status: AC
  Administered 2016-02-13: 324 mg via ORAL
  Filled 2016-02-13: qty 4

## 2016-02-13 MED ORDER — SODIUM CHLORIDE 0.9 % IV SOLN
250.0000 mg | INTRAVENOUS | Status: DC | PRN
  Administered 2016-02-13 (×2): 1.75 mg/kg/h via INTRAVENOUS

## 2016-02-13 MED ORDER — LIDOCAINE HCL (PF) 1 % IJ SOLN
INTRAMUSCULAR | Status: AC
  Filled 2016-02-13: qty 30

## 2016-02-13 MED ORDER — BIVALIRUDIN BOLUS VIA INFUSION - CUPID
INTRAVENOUS | Status: DC | PRN
  Administered 2016-02-13: 51 mg via INTRAVENOUS

## 2016-02-13 MED ORDER — HEPARIN SODIUM (PORCINE) 5000 UNIT/ML IJ SOLN
INTRAMUSCULAR | Status: AC
  Filled 2016-02-13: qty 1

## 2016-02-13 MED ORDER — CLOPIDOGREL BISULFATE 300 MG PO TABS
ORAL_TABLET | ORAL | Status: AC
  Filled 2016-02-13: qty 1

## 2016-02-13 MED ORDER — ASPIRIN 81 MG PO CHEW
81.0000 mg | CHEWABLE_TABLET | Freq: Every day | ORAL | Status: DC
  Administered 2016-02-14 – 2016-02-15 (×2): 81 mg via ORAL
  Filled 2016-02-13 (×2): qty 1

## 2016-02-13 SURGICAL SUPPLY — 23 items
BALLN EMERGE MR 2.5X15 (BALLOONS) ×3
BALLN ~~LOC~~ EMERGE MR 3.25X20 (BALLOONS) ×3
BALLOON EMERGE MR 2.5X15 (BALLOONS) IMPLANT
BALLOON ~~LOC~~ EMERGE MR 3.25X20 (BALLOONS) IMPLANT
CATH INFINITI 5FR ANG PIGTAIL (CATHETERS) ×1 IMPLANT
CATH INFINITI 5FR JL4 (CATHETERS) ×1 IMPLANT
CATH INFINITI JR4 5F (CATHETERS) ×1 IMPLANT
GLIDESHEATH SLEND SS 6F .021 (SHEATH) ×1 IMPLANT
GUIDE CATH RUNWAY 6FR CLS3.5 (CATHETERS) ×1 IMPLANT
KIT ENCORE 26 ADVANTAGE (KITS) ×1 IMPLANT
KIT HEART LEFT (KITS) ×3 IMPLANT
PACK CARDIAC CATHETERIZATION (CUSTOM PROCEDURE TRAY) ×3 IMPLANT
SHEATH PINNACLE 5F 10CM (SHEATH) ×1 IMPLANT
SHEATH PINNACLE 6F 10CM (SHEATH) ×1 IMPLANT
STENT SYNERGY DES 3X24 (Permanent Stent) ×1 IMPLANT
SYR MEDRAD MARK V 150ML (SYRINGE) ×3 IMPLANT
TRANSDUCER W/STOPCOCK (MISCELLANEOUS) ×3 IMPLANT
TUBING CIL FLEX 10 FLL-RA (TUBING) ×3 IMPLANT
VALVE GUARDIAN II ~~LOC~~ HEMO (MISCELLANEOUS) ×1 IMPLANT
WIRE ASAHI PROWATER 180CM (WIRE) ×1 IMPLANT
WIRE EMERALD 3MM-J .035X150CM (WIRE) ×1 IMPLANT
WIRE HI TORQ VERSACORE-J 145CM (WIRE) ×1 IMPLANT
WIRE SAFE-T 1.5MM-J .035X260CM (WIRE) ×1 IMPLANT

## 2016-02-13 NOTE — H&P (Signed)
Primary Physician: Primary Cardiologist:  Kristen LoaderKonswaran   HPI:Pt is a 50 yo with no known CAD  She has a history of CP  Seen in [facility] recently by cardiology Almyra Free(SKoneswaran)  Where she was admitted for CP  Hx of Barrett's esophagus with dilation, tobacco abuse, HTN and anxiety/depression.  Peak trop in June 2017 was 0.06  She was set up for out pt myoview Today she came in for procedure  EKG initally with exercise showed extensive PVCs  Switched to Lexiscan  Pt develped CP with this and signif EKG chagnes that occurred late and persisted / worsened in recovery [facility]  Heparin started  NTG given  Plans made for Tx fo [place] for L heart cath.  Pt said pain prior to tx was 8/10          Past Medical History  Diagnosis Date  . Hypertension   . Stomach ulcer   . Esophagus, Barrett's   . Anxiety   . GERD (gastroesophageal reflux disease)     Medications Prior to Admission  Medication Sig Dispense Refill  . aluminum-magnesium hydroxide-simethicone (MAALOX) 200-200-20 MG/5ML SUSP Take 30 mLs by mouth 3 (three) times daily as needed. 1120 mL   . aspirin EC 81 MG tablet Take 1 tablet (81 mg total) by mouth daily. 30 tablet 1  . diazepam (VALIUM) 5 MG tablet Take one tablet by mouth once daily  5  . dicyclomine (BENTYL) 10 MG capsule Take 1 capsule (10 mg total) by mouth 4 (four) times daily -  before meals and at bedtime. 120 capsule 3  . escitalopram (LEXAPRO) 10 MG tablet Take one tablet by mouth once daily  11  . nitroGLYCERIN (NITROSTAT) 0.4 MG SL tablet Place 1 tablet (0.4 mg total) under the tongue every 5 (five) minutes as needed for chest pain. 30 tablet 12  . omeprazole (PRILOSEC) 40 MG capsule Take 1 capsule (40 mg total) by mouth daily. 30 capsule 10  . triamterene-hydrochlorothiazide (DYAZIDE) 37.5-25 MG capsule Take one tablet by mouth once daily  11       Infusions:   No Known Allergies  Social History   Social History  . Marital Status: Divorced   Spouse Name: N/A  . Number of Children: N/A  . Years of Education: N/A   Occupational History  . Not on file.   Social History Main Topics  . Smoking status: Current Every Day Smoker -- 1.00 packs/day for 16 years    Types: Cigarettes  . Smokeless tobacco: Not on file  . Alcohol Use: Yes     Comment: occ  . Drug Use: No  . Sexual Activity: Yes    Birth Control/ Protection: Surgical   Other Topics Concern  . Not on file   Social History Narrative    Family History  Problem Relation Age of Onset  . Colon cancer Neg Hx   . Heart attack Father   . Heart attack Mother     REVIEW OF SYSTEMS:  All systems reviewed  Negative to the above problem except as noted above.    PHYSICAL EXAM: Filed Vitals:   02/13/16 1110  BP: 123/89  Pulse: 83  Temp: 98 F (36.7 C)  Resp: 18    No intake or output data in the 24 hours ending 02/13/16 1200  General:  Pt appears anxious  Pain in chest 10/10 HEENT: normal Neck: supple. no JVD. Carotids 2+ bilat; no bruits. No lymphadenopathy or thryomegaly appreciated.  Cor: PMI nondisplaced. Regular rate & rhythm. No rubs, gallops or murmurs. Lungs: clear Abdomen: soft, nontender, nondistended. No hepatosplenomegaly. No bruits or masses. Good bowel sounds. Extremities: no cyanosis, clubbing, rash, edema Neuro: alert & oriented x 3, cranial nerves grossly intact. moves all 4 extremities w/o difficulty. Affect pleasant.  ECG:    Results for orders placed or performed during the hospital encounter of 02/13/16 (from the past 24 hour(s))  I-stat troponin, ED     Status: None   Collection Time: 02/13/16 11:15 AM  Result Value Ref Range   Troponin i, poc 0.00 0.00 - 0.08 ng/mL   Comment 3           No results found.   ASSESSMENT: Pt is a 50 yo with cardiac risk factors and marked changes on EKG with Lexiscan with CP Pt's symptoms have improved minimally with NTG  Tele in ER with improvement in ST segments.   I discussed cath with pt    Risks / benefits explained  She agrees to proceed  Heparin and 325 ASA  Given (81 x 4)     2  HCM Needs to quit smoking  COunselled on cessation  Will need to get lipids  Start empirically on high dose lipitor  3  Hypothyroid  COntinue synthroid.    4  GI   Empiric PPI  CBC pending

## 2016-02-13 NOTE — ED Notes (Signed)
Patient arrives from [facility]hab. + stress test today. C/o chest pain at current time.

## 2016-02-13 NOTE — ED Provider Notes (Signed)
Medical screening examination/treatment/procedure(s) were conducted as a shared visit with non-physician practitioner(s) and myself.  I personally evaluated the patient during the encounter.  50 yo F here from cardiology office for cp after stress test, concern for ongoing angina. ECG ok. Patient in significant pain. Heparin started. NTG started. [facility] at bedside and will transfer to [facility] for cath lab.   CRITICAL CARE Performed by: Marily MemosMesner, Cherylanne Ardelean Total critical care time: 35 minutes Critical care time was exclusive of separately billable procedures and treating other patients. Critical care was necessary to treat or prevent imminent or life-threatening deterioration. Critical care was time spent personally by me on the following activities: development of treatment plan with patient and/or surrogate as well as nursing, discussions with consultants, evaluation of patient's response to treatment, examination of patient, obtaining history from patient or surrogate, ordering and performing treatments and interventions, ordering and review of laboratory studies, ordering and review of radiographic studies, pulse oximetry and re-evaluation of patient's condition.  My ECG Read Indication:chest pain EKG was personally contemporaneously reviewed by myself. Rate: 88  QRS duration: 78 QT/QTC: 377/457 Axis: normal EKG: normal EKG, normal sinus rhythm, unchanged from previous tracings. Other significant findings: n/a   Marily MemosJason Kayley Zeiders, MD 02/13/16 1610

## 2016-02-13 NOTE — Telephone Encounter (Signed)
Tried to call pt- she was not home, LM to return call.

## 2016-02-13 NOTE — ED Provider Notes (Signed)
CSN: [id number]     Arrival date & time 02/13/16  1107 History   First MD Initiated Contact with Patient 02/13/16 1109     Chief Complaint  Patient presents with  . Chest Pain     (Consider location/radiation/quality/duration/timing/severity/associated sxs/prior Treatment) Patient is a 50 y.o. female presenting with chest pain. The history is provided by the patient and a caregiver. No language interpreter was used.  Chest Pain Pain location:  Substernal area Pain quality: aching   Pain radiates to:  Does not radiate Pain radiates to the back: no   Pain severity:  Moderate Onset quality:  Gradual Timing:  Constant Progression:  Worsening Chronicity:  Recurrent Context: not breathing and no movement   Relieved by:  Nothing Worsened by:  Nothing tried Ineffective treatments:  None tried Associated symptoms: no altered mental status and no weakness   Risk factors: hypertension   Pt brought to ED by Dr. Tenny Crawoss.  Pt had a positive stress test and Dr. Tenny Crawoss is requesting transfer to cath lab.   [facility] has been called and is at bedside.  Past Medical History  Diagnosis Date  . Hypertension   . Stomach ulcer   . Esophagus, Barrett's   . Anxiety   . GERD (gastroesophageal reflux disease)    Past Surgical History  Procedure Laterality Date  . Abdominal hysterectomy    . Cholecystectomy N/A 03/03/2015    Procedure: LAPAROSCOPIC CHOLECYSTECTOMY;  Surgeon: Franky MachoMark Jenkins, MD;  Location: [facility];  Service: General;  Laterality: N/A;  . Hernia repair      umbilical  . Colonoscopy with propofol N/A 01/26/2016    Procedure: COLONOSCOPY WITH PROPOFOL;  Surgeon: Corbin Adeobert M Rourk, MD;  Location: [facility];  Service: Endoscopy;  Laterality: N/A;  815  . Esophagogastroduodenoscopy (egd) with propofol N/A 01/26/2016    Procedure: ESOPHAGOGASTRODUODENOSCOPY (EGD) WITH PROPOFOL;  Surgeon: Corbin Adeobert M Rourk, MD;  Location: [facility];  Service: Endoscopy;  Laterality: N/A;  Elease Hashimoto. Maloney dilation N/A  01/26/2016    Procedure: Elease HashimotoMALONEY DILATION;  Surgeon: Corbin Adeobert M Rourk, MD;  Location: [facility];  Service: Endoscopy;  Laterality: N/A;  . Biopsy  01/26/2016    Procedure: BIOPSY;  Surgeon: Corbin Adeobert M Rourk, MD;  Location: [facility];  Service: Endoscopy;;  gastric  . Polypectomy  01/26/2016    Procedure: POLYPECTOMY;  Surgeon: Corbin Adeobert M Rourk, MD;  Location: [facility];  Service: Endoscopy;;  sigmoid colon  . Esophagogastroduodenoscopy  May 2014    Nichols, Dr. Marva PandaSkulskie: Grade D erosive esophagitis, Z-line irregular and biopsied, hiatal hernia, gastritis, duodenal biopsy negative for celiac. Biopsy comment from [facility] states "if characteristic lesion was identified endoscopically within the GE junction, the histologic findings would fulfill criteria for barrett's.    Family History  Problem Relation Age of Onset  . Colon cancer Neg Hx   . Heart attack Father   . Heart attack Mother    Social History  Substance Use Topics  . Smoking status: Current Every Day Smoker -- 1.00 packs/day for 16 years    Types: Cigarettes  . Smokeless tobacco: Not on file  . Alcohol Use: Yes     Comment: occ   OB History    No data available     Review of Systems  Cardiovascular: Positive for chest pain.  Neurological: Negative for weakness.  All other systems reviewed and are negative.     Allergies  Review of patient's allergies indicates no known allergies.  Home Medications  Prior to Admission medications   Medication Sig Start Date End Date Taking? Authorizing Provider  aluminum-magnesium hydroxide-simethicone (MAALOX) 200-200-20 MG/5ML SUSP Take 30 mLs by mouth 3 (three) times daily as needed. 02/03/16   Erick BlinksJehanzeb Memon, MD  aspirin EC 81 MG tablet Take 1 tablet (81 mg total) by mouth daily. 02/03/16   Erick BlinksJehanzeb Memon, MD  diazepam (VALIUM) 5 MG tablet Take one tablet by mouth once daily 01/02/16   Historical Provider, MD  dicyclomine (BENTYL) 10 MG capsule Take 1 capsule (10 mg total) by  mouth 4 (four) times daily -  before meals and at bedtime. 01/22/16   Nira RetortAnna W Sams, NP  escitalopram (LEXAPRO) 10 MG tablet Take one tablet by mouth once daily 01/02/16   Historical Provider, MD  nitroGLYCERIN (NITROSTAT) 0.4 MG SL tablet Place 1 tablet (0.4 mg total) under the tongue every 5 (five) minutes as needed for chest pain. 02/03/16   Erick BlinksJehanzeb Memon, MD  omeprazole (PRILOSEC) 40 MG capsule Take 1 capsule (40 mg total) by mouth daily. 02/03/16   Erick BlinksJehanzeb Memon, MD  triamterene-hydrochlorothiazide (DYAZIDE) 37.5-25 MG capsule Take one tablet by mouth once daily 01/02/16   Historical Provider, MD   There were no vitals taken for this visit. Physical Exam  Constitutional: She is oriented to person, place, and time. She appears well-developed and well-nourished.  HENT:  Head: Normocephalic.  Eyes: EOM are normal. Pupils are equal, round, and reactive to light.  Neck: Normal range of motion.  Cardiovascular: Normal rate.   Pulmonary/Chest: Effort normal and breath sounds normal.  Abdominal: She exhibits no distension.  Musculoskeletal: Normal range of motion.  Neurological: She is alert and oriented to person, place, and time.  Psychiatric: She has a normal mood and affect.  Nursing note and vitals reviewed.   ED Course  Procedures (including critical care time) Labs Review Labs Reviewed - No data to display  Imaging Review No results found. I have personally reviewed and evaluated these images and lab results as part of my medical decision-making.   EKG Interpretation None      MDM   Final diagnoses:  Chest pain, unspecified chest pain type    Transfer to cath lab    Elson AreasLeslie K Sofia, PA-C 02/13/16 [address]1113  Leslie K AlmaSofia, PA-C 02/13/16 1114  Marily MemosJason Mesner, MD 02/13/16 1430

## 2016-02-13 NOTE — Progress Notes (Signed)
Site area: right groin  Site Prior to Removal:  Level 0  Pressure Applied For 20 MINUTES    Minutes Beginning at 1725  Manual:   Yes.    Patient Status During Pull:  stable  Post Pull Groin Site:  Level 0  Post Pull Instructions Given:  Yes.    Post Pull Pulses Present:  Yes.    Dressing Applied:  Yes.    Comments:   

## 2016-02-13 NOTE — Interval H&P Note (Signed)
Cath Lab Visit (complete for each Cath Lab visit)  Clinical Evaluation Leading to the Procedure:   ACS: Yes.    Non-ACS:    Anginal Classification: CCS IV  Anti-ischemic medical therapy: Minimal Therapy (1 class of medications)  Non-Invasive Test Results: Intermediate-risk stress test findings: cardiac mortality 1-3%/year  Prior CABG: No previous CABG   Active chest pain   History and Physical Interval Note:  02/13/2016 12:19 PM  Jocelyn Richard  has presented today for surgery, with the diagnosis of Chest Pain  The various methods of treatment have been discussed with the patient and family. After consideration of risks, benefits and other options for treatment, the patient has consented to  Procedure(s): Left Heart Cath and Coronary Angiography (N/A) as a surgical intervention .  The patient's history has been reviewed, patient examined, no change in status, stable for surgery.  I have reviewed the patient's chart and labs.  Questions were answered to the patient's satisfaction.     Lance MussJayadeep Varanasi

## 2016-02-14 DIAGNOSIS — I119 Hypertensive heart disease without heart failure: Secondary | ICD-10-CM

## 2016-02-14 DIAGNOSIS — I249 Acute ischemic heart disease, unspecified: Principal | ICD-10-CM

## 2016-02-14 DIAGNOSIS — E785 Hyperlipidemia, unspecified: Secondary | ICD-10-CM

## 2016-02-14 DIAGNOSIS — E876 Hypokalemia: Secondary | ICD-10-CM

## 2016-02-14 LAB — BASIC METABOLIC PANEL
ANION GAP: 5 (ref 5–15)
BUN: 13 mg/dL (ref 6–20)
CHLORIDE: 109 mmol/L (ref 101–111)
CO2: 26 mmol/L (ref 22–32)
Calcium: 8.8 mg/dL — ABNORMAL LOW (ref 8.9–10.3)
Creatinine, Ser: 0.75 mg/dL (ref 0.44–1.00)
GFR calc non Af Amer: >60 mL/min (ref >60)
GLUCOSE: 117 mg/dL — AB (ref 65–99)
POTASSIUM: 3.3 mmol/L — AB (ref 3.5–5.1)
SODIUM: 140 mmol/L (ref 135–145)

## 2016-02-14 LAB — CBC
HEMATOCRIT: 39.2 % (ref 36.0–46.0)
HEMOGLOBIN: 12.6 g/dL (ref 12.0–15.0)
MCH: 31.4 pg (ref 26.0–34.0)
MCHC: 32.1 g/dL (ref 30.0–36.0)
MCV: 97.8 fL (ref 78.0–100.0)
Platelets: 254 10*3/uL (ref 150–400)
RBC: 4.01 MIL/uL (ref 3.87–5.11)
RDW: 13.8 % (ref 11.5–15.5)
WBC: 9.1 10*3/uL (ref 4.0–10.5)

## 2016-02-14 LAB — LIPID PANEL
CHOL/HDL RATIO: 5.3 ratio
CHOLESTEROL: 176 mg/dL (ref 0–200)
HDL: 33 mg/dL — ABNORMAL LOW (ref >40)
LDL CALC: 117 mg/dL — AB (ref 0–99)
Triglycerides: 132 mg/dL (ref <150)
VLDL: 26 mg/dL (ref 0–40)

## 2016-02-14 LAB — TROPONIN I: Troponin I: <0.03 ng/mL (ref <0.03)

## 2016-02-14 MED ORDER — MORPHINE SULFATE (PF) 2 MG/ML IV SOLN
INTRAVENOUS | Status: AC
  Administered 2016-02-14: 1 mg via INTRAVASCULAR
  Filled 2016-02-14: qty 1

## 2016-02-14 MED ORDER — POTASSIUM CHLORIDE CRYS ER 20 MEQ PO TBCR
40.0000 meq | EXTENDED_RELEASE_TABLET | Freq: Once | ORAL | Status: AC
  Administered 2016-02-14: 40 meq via ORAL
  Filled 2016-02-14: qty 2

## 2016-02-14 MED ORDER — MORPHINE SULFATE (PF) 2 MG/ML IV SOLN: 1.0000 mg | Freq: Once | INTRAVENOUS | Status: AC

## 2016-02-14 NOTE — Progress Notes (Signed)
Nutrition Brief Note  Patient identified on the Malnutrition Screening Tool (MST) Report  Wt Readings from Last 15 Encounters:  02/13/16 150 lb (68.04 kg)  02/02/16 160 lb (72.576 kg)  01/26/16 152 lb (68.947 kg)  01/22/16 152 lb (68.947 kg)  01/22/16 152 lb 3.2 oz (69.037 kg)  02/27/15 156 lb 6 oz (70.931 kg)  12/13/14 160 lb (72.576 kg)  07/28/14 155 lb (70.308 kg)  10/28/12 140 lb (63.504 kg)  09/30/12 140 lb (63.504 kg)  03/03/12 150 lb (68.04 kg)    Body mass index is 26.58 kg/(m^2). Patient meets criteria for Overweight based on current BMI.   MST states pt has lost 12 lbs and hadn't been eating well due to poor appetite. When RD arrived, she was surprised and asked the other people in the room "Who told?". She does say she has a poor appetite, but this is nothing new. She eats one meal a day, but says she has not lost any weight. RD explained that her body has likely adapted to only eating one time a day. "So I need to eat 3 meals to lose weight". RD said yes.   Current diet order is Heart Healthy, patient is consuming approximately 75% of meals at this time and her weight is overall stable.   No nutrition interventions warranted at this time. If nutrition issues arise, please consult RD.   Christophe LouisNathan Franks RD, LDN, CNSC Clinical Nutrition Pager: [phone] 02/14/2016 3:54 PM

## 2016-02-14 NOTE — Progress Notes (Signed)
CARDIAC REHAB PHASE I   PRE:  Rate/Rhythm: 62 SR    BP: sitting 115/74    SaO2:   MODE:  Ambulation: 1000 ft   POST:  Rate/Rhythm: 76 SR    BP: sitting 132/72     SaO2:   Pt sts her CP is 6/10 in bed. She was able to walk 1000 ft at fairly slow pace. Sts she actually felt better being up and walking, although she still rates her CP at 5/10. To recliner after walk. Ed completed with pt and s/o. Very receptive. Plans to quit smoking. She is significantly stressed at home, encouraged stress relief through ex, yoga, prayer, deep breathing instead of smoking. She agreed. Understands importance of Plavix/ASA. [facility] however her work schedule might be a problem. She lifts 60 lbs at work, encouraged her to discuss this with MD. [phone]   Harriet Massonandi Kristan Shaden Higley CES, ACSM 02/14/2016 9:00 AM

## 2016-02-14 NOTE — Plan of Care (Signed)
Problem: Education: Goal: Understanding of cardiac disease, CV risk reduction, and recovery process will improve Outcome: Progressing Pt educated on CAD, radial and groin site care, smoking cessation, pt states she "officially quit yesterday". nicotine patch applied. HR noted to be in mid 40s, cardiology made aware, transferred from 6c- received 12.5 metoprolol this am. Pt states she feels tired but has no chest pain at this time, "ready to go home" aware cardiology would like to keep her overnight. Pt resting comfortably, boyfriend at bedside.

## 2016-02-14 NOTE — Progress Notes (Signed)
Patient Name: Jocelyn CahillDenise W Richard Date of Encounter: 02/14/2016  Hospital Problem List     Principal Problem:   ACS (acute coronary syndrome) [facility](HCC) Active Problems:   Tobacco abuse   Hypertensive heart disease   Hyperlipidemia   Barrett esophagus   CAD (coronary artery disease)    Subjective   S/p PCI LAD yesterday.  Has been having "8/10" sscp/pressure since procedure.  No dyspnea.  Pain not worse with deep breathing, palpation, position changes, or ambulation.  Inpatient Medications    . aspirin  81 mg Oral Daily  . atorvastatin  80 mg Oral q1800  . clopidogrel  75 mg Oral Q breakfast  . diazepam  5 mg Oral Daily  . dicyclomine  10 mg Oral TID AC & HS  . escitalopram  10 mg Oral Daily  . feeding supplement (ENSURE ENLIVE)  237 mL Oral BID BM  . heparin  5,000 Units Subcutaneous Q8H  . metoprolol tartrate  12.5 mg Oral BID  . nicotine  21 mg Transdermal Q24H  . pantoprazole  40 mg Oral Daily  . sodium chloride flush  3 mL Intravenous Q12H  . triamterene-hydrochlorothiazide  1 capsule Oral Daily    Vital Signs    Filed Vitals:   02/13/16 2100 02/13/16 2130 02/13/16 2150 02/14/16 0335  BP: 95/61 91/69  93/61  Pulse: 63 63 59   Temp:    98.1 F (36.7 C)  TempSrc:    Oral  Resp: 19 13  19   Height:      Weight:      SpO2: 96% 96%  98%    Intake/Output Summary (Last 24 hours) at 02/14/16 0745 Last data filed at 02/13/16 1955  Gross per 24 hour  Intake    762 ml  Output    800 ml  Net    -38 ml   Filed Weights   02/13/16 1110  Weight: 150 lb (68.04 kg)    Physical Exam    General: Pleasant, NAD. Neuro: Alert and oriented X 3. Moves all extremities spontaneously. Psych: Normal affect. HEENT:  Normal  Neck: Supple without bruits or JVD. Lungs:  Resp regular and unlabored, CTA. Heart: RRR no s3, s4, or murmurs. Abdomen: Soft, non-tender, non-distended, BS + x 4.  Extremities: No clubbing, cyanosis or edema. DP/PT/Radials 2+ and equal bilaterally.  R wrist  cath site w/o bleeding/bruit/hematoma.  Labs    CBC  Recent Labs  02/13/16 1501 02/14/16 0211  WBC 8.7 9.1  HGB 13.4 12.6  HCT 41.4 39.2  MCV 94.7 97.8  PLT 285 254   Basic Metabolic Panel  Recent Labs  02/13/16 1501 02/14/16 0211  NA  --  140  K  --  3.3*  CL  --  109  CO2  --  26  GLUCOSE  --  117*  BUN  --  13  CREATININE 0.76 0.75  CALCIUM  --  8.8*   Cardiac Enzymes  Recent Labs  02/13/16 1501 02/13/16 2018 02/14/16 0211  TROPONINI <0.03 <0.03 <0.03   Fasting Lipid Panel  Recent Labs  02/14/16 0211  CHOL 176  HDL 33*  LDLCALC 117*  TRIG 132  CHOLHDL 5.3    Telemetry    Sinus brady, RSR  ECG    Sinus Brady, 58, no acute ST/T changes.  Radiology    Dg Chest 2 View  02/02/2016  CLINICAL DATA:  Mid chest pain on off for 2-3 months. EXAM: CHEST  2 VIEW COMPARISON:  07/28/2014 FINDINGS:  The heart size and mediastinal contours are within normal limits. Both lungs are clear. The visualized skeletal structures are unremarkable. IMPRESSION: No active cardiopulmonary disease. Electronically Signed   By: Charlett NoseKevin  Dover M.D.   On: 02/02/2016 13:38   Nm Myocar Multi W/spect W/wall Motion / Ef  02/13/2016   Downsloping ST segment depression ST segment depression was noted during stress in the II, III, aVF, V5 and V6 leads. ST depression became significant at end of test, early recovery. Got worse initially to downsloping Near normalized by 21 min recovery  Nuclear scan aborted Pt taken to ER.     Assessment & Plan    1.  Acute coronary syndrome/CAD:  [facility] where she developed severe c/p with ST changes.  Trops neg.  Cath revealed severe LAD dzs and she is now s/p DES.  Otw nonobs dzs.  She has been having 8/10 chest pressure since her procedure.  This does not change with palpation, deep breathing, position changes, or ambulation.  BP has been soft overnight and she has not received any NTG.  ECG this AM  w/o acute changes.  I will give 1mg  of morphine now and recycle troponin.  She also has a h/o barrett's esophagus though she says she can differentiate between GERD pain and what she would consider cardiac chest pain.  Will plan to keep her in the hospital today.  Cont asa, statin, plavix,  blocker.  D/c diuretic with soft BPs.  Cont PPI.  2.  Hypertensive Heart Disease:  BP low.  D/c diuretic.  3.  HL:  LDL 117.  Cont high potency statin.  4.  Hypokalemia:  D/c diuretic.  Supplement.  5.  Tobacco Abuse: complete cessation advised.  She says that she is committed to quitting.  Signed, Nicolasa [name] NP As above, patient seen and examined. She complains of continuous chest pain since intervention yesterday. Her electrocardiogram shows no acute ST changes. Initial follow-up enzymes negative. Doubt pain is cardiac. We will watch today and discharge tomorrow morning if stable. Discontinue diuretic as blood pressure is borderline. Continue aspirin, Plavix, statin and metoprolol. Patient counseled on discontinuing tobacco. Supplement potassium.  Olga MillersBrian Jaine Estabrooks, MD

## 2016-02-14 NOTE — Progress Notes (Signed)
Patient reported that she has had chest tightness and heaviness all night.  NP and MD aware.  Patient reports that walking with cardiac rehab and deep breathing makes it "a little better".  Morning medications given, including maalox, tramadol, valium, and morphine.  Will reassess.  Boy friend at bedside offering great support. Transfer pending.

## 2016-02-15 ENCOUNTER — Encounter (HOSPITAL_COMMUNITY): Payer: Self-pay | Admitting: Nurse Practitioner

## 2016-02-15 MED ORDER — CLOPIDOGREL BISULFATE 75 MG PO TABS: 75.0000 mg | ORAL_TABLET | Freq: Every day | ORAL | Status: DC

## 2016-02-15 MED ORDER — METOPROLOL TARTRATE 25 MG PO TABS: 12.5000 mg | ORAL_TABLET | Freq: Two times a day (BID) | ORAL | Status: DC

## 2016-02-15 MED ORDER — NICOTINE 14 MG/24HR TD PT24: 14.0000 mg | MEDICATED_PATCH | TRANSDERMAL | Status: AC

## 2016-02-15 MED ORDER — PANTOPRAZOLE SODIUM 40 MG PO TBEC: 40.0000 mg | DELAYED_RELEASE_TABLET | Freq: Every day | ORAL | Status: DC

## 2016-02-15 MED ORDER — ATORVASTATIN CALCIUM 80 MG PO TABS: 80.0000 mg | ORAL_TABLET | Freq: Every day | ORAL | Status: DC

## 2016-02-15 NOTE — Discharge Summary (Signed)
Discharge Summary    Patient ID: Jocelyn Richard,  MRN: [id number], DOB/AGE: 1966/01/18 50 y.o.  Admit date: 02/13/2016 Discharge date: 02/15/2016  Primary Care Provider: Lenise HeraldMANN, BENJAMIN Primary Cardiologist: Junius ArgyleS. Koneswaran, MD    Discharge Diagnoses    Principal Problem:   ACS (acute coronary syndrome) [facility](HCC)  **s/p PCI/DES to the LAD this admission.  Active Problems:   CAD (coronary artery disease)     Tobacco abuse   Hypertensive heart disease   Hyperlipidemia   Barrett esophagus   Hypokalemia    Allergies No Known Allergies  Diagnostic Studies/Procedures    Cardiac Catheterization and Percutaneous Coronary Intervention 6.30.2017  Coronary Findings     Dominance: Right    Left Anterior Descending  The vessel exhibits minimal luminal irregularities.   . Mid LAD lesion, 90% stenosed.   Marland Kitchen. PCI: The mid LAD was successfully treated with a 3.0 x 24 mm Synergy DES.  Marland Kitchen. There is no residual stenosis post intervention.        Left Circumflex   . Ost Cx lesion, 25% stenosed.      Right Coronary Artery  The vessel exhibits minimal luminal irregularities.     Left Ventricle The left ventricular size is normal. The left ventricular systolic function is normal. The left ventricular ejection fraction is 55-65% by visual estimate. There are no wall motion abnormalities in the left ventricle.   Aortic Valve There is no aortic valve stenosis. _____________   History of Present Illness     50 year old female with a history of hypertension, hyperlipidemia, and tobacco abuse. [facility] with chest pain and mild troponin elevation.She was seen by cardiology with recommendations for outpatient stress testing. She presented for stress testing on July 30 And developed severe chest pain with ST segment changes. [facility].   Hospital Course     Consultants: none   Following  admission, patient ruled out for myocardial infarction. She underwent diagnostic catheterization on June 30, revealing severe LAD disease. This was successfully treated with a synergy drug-eluting stent. Postprocedure, she continued to complain of moderate substernal chest discomfort without associated symptoms. ECG remained normal. Repeat troponins were evaluated on July 1, and also remained normal. Patient's chest pain eventually subsided and she has since been ambulating without recurrent symptoms or limitations. She has been seen by cardiac rehabilitation with outpatient rehabilitation referral made. She has been counseled on importance of smoking cessation and will be discharged home today in good condition.   Of note, in the setting of relative hypotension, we have discontinued her previous home dose of diuretic therapy in favor of low-dose beta blocker. She has also been placed on High potency statin therapy in the setting of acute coronary syndrome and will require follow-up lipids and LFTs in approximately 4 weeks. _____________  Discharge Vitals Blood pressure 124/85, pulse 65, temperature 97.7 F (36.5 C), temperature source Oral, resp. rate 14, height 5\' 3"  (1.6 m), weight 151 lb 14.4 oz (68.901 kg), SpO2 98 %.  Filed Weights   02/13/16 1110 02/15/16 0500  Weight: 150 lb (68.04 kg) 151 lb 14.4 oz (68.901 kg)    Labs & Radiologic Studies    CBC  Recent Labs  02/13/16 1501 02/14/16 0211  WBC 8.7 9.1  HGB 13.4 12.6  HCT 41.4 39.2  MCV 94.7 97.8  PLT 285 254   Basic Metabolic Panel  Recent Labs  02/13/16  1501 02/14/16 0211  NA  --  140  K  --  3.3*  CL  --  109  CO2  --  26  GLUCOSE  --  117*  BUN  --  13  CREATININE 0.76 0.75  CALCIUM  --  8.8*   Cardiac Enzymes  Recent Labs  02/13/16 2018 02/14/16 0211 02/14/16 0823  TROPONINI <0.03 <0.03 <0.03   Fasting Lipid Panel  Recent Labs  02/14/16 0211  CHOL 176  HDL 33*  LDLCALC 117*  TRIG 132  CHOLHDL  5.3   _____________  Dg Chest 2 View  02/02/2016  CLINICAL DATA:  Mid chest pain on off for 2-3 months. EXAM: CHEST  2 VIEW COMPARISON:  07/28/2014 FINDINGS: The heart size and mediastinal contours are within normal limits. Both lungs are clear. The visualized skeletal structures are unremarkable. IMPRESSION: No active cardiopulmonary disease. Electronically Signed   By: Charlett NoseKevin  Dover M.D.   On: 02/02/2016 13:38   Nm Myocar Multi W/spect W/wall Motion / Ef  02/13/2016   Downsloping ST segment depression ST segment depression was noted during stress in the II, III, aVF, V5 and V6 leads. ST depression became significant at end of test, early recovery. Got worse initially to downsloping Near normalized by 21 min recovery  Nuclear scan aborted Pt taken to ER.    Disposition   Pt is being discharged home today in good condition.  Follow-up Plans & Appointments    Follow-up Information    Follow up with Prentice DockerSuresh Koneswaran, MD In 1 week.   Specialty:  Cardiology   Why:  we will arrange for follow-up and contact you.   Contact information:   [address] [place] [id number] [phone]      Discharge Instructions    Amb Referral to Cardiac Rehabilitation    Complete by:  As directed   Diagnosis:   Coronary Stents PTCA             Discharge Medications   Current Discharge Medication List    START taking these medications   Details  atorvastatin (LIPITOR) 80 MG tablet Take 1 tablet (80 mg total) by mouth daily at 6 PM. Qty: 30 tablet, Refills: 6    clopidogrel (PLAVIX) 75 MG tablet Take 1 tablet (75 mg total) by mouth daily with breakfast. Qty: 30 tablet, Refills: 6    metoprolol tartrate (LOPRESSOR) 25 MG tablet Take 0.5 tablets (12.5 mg total) by mouth 2 (two) times daily. Qty: 30 tablet, Refills: 6    nicotine (NICODERM CQ - DOSED IN MG/24 HOURS) 14 mg/24hr patch Place 1 patch (14 mg total) onto the skin daily. Qty: 30 patch, Refills: 1    pantoprazole (PROTONIX) 40  MG tablet Take 1 tablet (40 mg total) by mouth daily. Qty: 30 tablet, Refills: 6      CONTINUE these medications which have NOT CHANGED   Details  aspirin EC 81 MG tablet Take 1 tablet (81 mg total) by mouth daily. Qty: 30 tablet, Refills: 1    diazepam (VALIUM) 5 MG tablet Take one tablet by mouth once daily Refills: 5    dicyclomine (BENTYL) 10 MG capsule Take 1 capsule (10 mg total) by mouth 4 (four) times daily -  before meals and at bedtime. Qty: 120 capsule, Refills: 3    escitalopram (LEXAPRO) 10 MG tablet Take one tablet by mouth once daily Refills: 11    naphazoline-glycerin (CLEAR EYES) 0.012-0.2 % SOLN Place 1 drop into both eyes daily.  nitroGLYCERIN (NITROSTAT) 0.4 MG SL tablet Place 1 tablet (0.4 mg total) under the tongue every 5 (five) minutes as needed for chest pain. Qty: 30 tablet, Refills: 12      STOP taking these medications     omeprazole (PRILOSEC) 40 MG capsule      triamterene-hydrochlorothiazide (DYAZIDE) 37.5-25 MG capsule      aluminum-magnesium hydroxide-simethicone (MAALOX) 200-200-20 MG/5ML SUSP          Aspirin prescribed at discharge?  Yes High Intensity Statin Prescribed? (Lipitor 40-80mg  or Crestor 20-40mg ): Yes Beta Blocker Prescribed? Yes For EF <40%, was ACEI/ARB Prescribed? No: N/A ADP Receptor Inhibitor Prescribed? (i.e. Plavix etc.-Includes Medically Managed Patients): Yes For EF <40%, Aldosterone Inhibitor Prescribed? No: N/A Was EF assessed during THIS hospitalization? Yes Was Cardiac Rehab II ordered? (Included Medically managed Patients): Yes   Outstanding Labs/Studies   Follow up lipids/lft's in 4 wks.  Duration of Discharge Encounter   Greater than 30 minutes including physician time.  Signed, Nicolasa Ducking NP 02/15/2016, 10:25 AM

## 2016-02-15 NOTE — Progress Notes (Signed)
Patient Name: Jocelyn CahillDenise W Richard Date of Encounter: 02/15/2016  Hospital Problem List     Principal Problem:   ACS (acute coronary syndrome) [facility](HCC) Active Problems:   Barrett esophagus   Tobacco abuse   Hypertensive heart disease   Hyperlipidemia   CAD (coronary artery disease)   Hypokalemia    Subjective   Denies CP or dyspnea  Inpatient Medications    . aspirin  81 mg Oral Daily  . atorvastatin  80 mg Oral q1800  . clopidogrel  75 mg Oral Q breakfast  . diazepam  5 mg Oral Daily  . dicyclomine  10 mg Oral TID AC & HS  . escitalopram  10 mg Oral Daily  . feeding supplement (ENSURE ENLIVE)  237 mL Oral BID BM  . heparin  5,000 Units Subcutaneous Q8H  . metoprolol tartrate  12.5 mg Oral BID  . nicotine  21 mg Transdermal Q24H  . pantoprazole  40 mg Oral Daily  . sodium chloride flush  3 mL Intravenous Q12H    Vital Signs    Filed Vitals:   02/14/16 1227 02/14/16 2100 02/15/16 0500 02/15/16 0826  BP: 96/55 96/63 99/65  124/85  Pulse: 52 48 65   Temp: 98.1 F (36.7 C) 98.7 F (37.1 C) 97.7 F (36.5 C)   TempSrc: Oral     Resp: 17 15 14    Height:      Weight:   151 lb 14.4 oz (68.901 kg)   SpO2: 96% 99% 98%     Intake/Output Summary (Last 24 hours) at 02/15/16 0948 Last data filed at 02/15/16 0824  Gross per 24 hour  Intake   1480 ml  Output   1200 ml  Net    280 ml   Filed Weights   02/13/16 1110 02/15/16 0500  Weight: 150 lb (68.04 kg) 151 lb 14.4 oz (68.901 kg)    Physical Exam    General: Pleasant, NAD. Neuro: Grossly intact HEENT:  Normal  Neck: Supple  Lungs:  CTA. Heart: RRR Abdomen: Soft, non-tender, non-distended Extremities: No edema. R wrist cath site w/o bleeding/bruit/hematoma.  Labs    CBC  Recent Labs  02/13/16 1501 02/14/16 0211  WBC 8.7 9.1  HGB 13.4 12.6  HCT 41.4 39.2  MCV 94.7 97.8  PLT 285 254   Basic Metabolic Panel  Recent Labs  02/13/16 1501 02/14/16 0211  NA  --  140  K  --  3.3*  CL  --  109  CO2  --   26  GLUCOSE  --  117*  BUN  --  13  CREATININE 0.76 0.75  CALCIUM  --  8.8*   Cardiac Enzymes  Recent Labs  02/13/16 2018 02/14/16 0211 02/14/16 0823  TROPONINI <0.03 <0.03 <0.03   Fasting Lipid Panel  Recent Labs  02/14/16 0211  CHOL 176  HDL 33*  LDLCALC 117*  TRIG 132  CHOLHDL 5.3    Telemetry    Sinus brady    Radiology    Dg Chest 2 View  02/02/2016  CLINICAL DATA:  Mid chest pain on off for 2-3 months. EXAM: CHEST  2 VIEW COMPARISON:  07/28/2014 FINDINGS: The heart size and mediastinal contours are within normal limits. Both lungs are clear. The visualized skeletal structures are unremarkable. IMPRESSION: No active cardiopulmonary disease. Electronically Signed   By: Charlett NoseKevin  Dover M.D.   On: 02/02/2016 13:38   Nm Myocar Multi W/spect W/wall Motion / Ef  02/13/2016   Downsloping ST segment depression ST  segment depression was noted during stress in the II, III, aVF, V5 and V6 leads. ST depression became significant at end of test, early recovery. Got worse initially to downsloping Near normalized by 21 min recovery  Nuclear scan aborted Pt taken to ER.     Assessment & Plan    1.  Acute coronary syndrome/CAD:  [facility] where she developed severe c/p with ST changes.  Trops neg.  Cath revealed severe LAD dzs and she is now s/p DES.  No further CP. Cont asa, statin, plavix,  blocker.  Cont PPI.  2.  Hypertensive Heart Disease:  Continue present BP meds  3.  HL:  LDL 117.  Continue statin. Lipids and liver 4 weeks.  4.  Tobacco Abuse: counseled on discontinuing; nicotine patch at DC  DC today and fu with Dr Purvis [place]oneswaran in [place] > 30 min PA and physician time D2  Signed, Olga MillersBrian Crenshaw, MD

## 2016-02-15 NOTE — Progress Notes (Signed)
Discharge teaching and instructions reviewed. VSS. Pt has no pain. Pt has no further questions, anxious to go home. Discharging via boyfriend.

## 2016-02-15 NOTE — Discharge Instructions (Signed)
**  PLEASE REMEMBER TO BRING ALL OF YOUR MEDICATIONS TO EACH OF YOUR FOLLOW-UP OFFICE VISITS. ° °NO HEAVY LIFTING OR SEXUAL ACTIVITY X 7 DAYS. °NO DRIVING X 3-5 DAYS. °NO SOAKING BATHS, HOT TUBS, POOLS, ETC., X 7 DAYS. ° °Radial Site Care °Refer to this sheet in the next few weeks. These instructions provide you with information on caring for yourself after your procedure. Your caregiver may also give you more specific instructions. Your treatment has been planned according to current medical practices, but problems sometimes occur. Call your caregiver if you have any problems or questions after your procedure. °HOME CARE INSTRUCTIONS °You may shower the day after the procedure. Remove the bandage (dressing) and gently wash the site with plain soap and water. Gently pat the site dry.  °Do not apply powder or lotion to the site.  °Do not submerge the affected site in water for 3 to 5 days.  °Inspect the site at least twice daily.  °Do not flex or bend the affected arm for 24 hours.  °No lifting over 5 pounds (2.3 kg) for 5 days after your procedure.  °Do not drive home if you are discharged the same day of the procedure. Have someone else drive you.  ° °What to expect: °Any bruising will usually fade within 1 to 2 weeks.  °Blood that collects in the tissue (hematoma) may be painful to the touch. It should usually decrease in size and tenderness within 1 to 2 weeks.  °SEEK IMMEDIATE MEDICAL CARE IF: °You have unusual pain at the radial site.  °You have redness, warmth, swelling, or pain at the radial site.  °You have drainage (other than a small amount of blood on the dressing).  °You have chills.  °You have a fever or persistent symptoms for more than 72 hours.  °You have a fever and your symptoms suddenly get worse.  °Your arm becomes pale, cool, tingly, or numb.  °You have heavy bleeding from the site. Hold pressure on the site.  °_____________  ° °  ° °10 Habits of Highly Healthy People ° °[facility] wants to help  you get well and stay well.  Live a longer, healthier life by practicing healthy habits every day. ° °1.  Visit your primary care provider regularly. °2.  Make time for family and friends.  Healthy relationships are important. °3.  Take medications as directed by your provider. °4.  Maintain a healthy weight and a trim waistline. °5.  Eat healthy meals and snacks, rich in fruits, vegetables, whole grains, and lean proteins. °6.  Get moving every day - aim for 150 minutes of moderate physical activity each week. °7.  Don't smoke. °8.  Avoid alcohol or drink in moderation. °9.  Manage stress through meditation or mindful relaxation. °10.  Get seven to nine hours of quality sleep each night. ° °Want more information on healthy habits?  To learn more about these and other healthy habits, visit [email or web address]. °_____________ °   °

## 2016-02-16 MED FILL — Heparin Sodium (Porcine) Inj 1000 Unit/ML: INTRAMUSCULAR | Qty: 10 | Status: AC

## 2016-02-16 NOTE — Telephone Encounter (Signed)
Patient made aware no further recommendations to follow and AS will see her in August

## 2016-02-20 ENCOUNTER — Ambulatory Visit (INDEPENDENT_AMBULATORY_CARE_PROVIDER_SITE_OTHER): Payer: BLUE CROSS/BLUE SHIELD | Admitting: Adult Health

## 2016-02-20 ENCOUNTER — Encounter: Payer: Self-pay | Admitting: Adult Health

## 2016-02-20 VITALS — BP 122/72 | HR 56 | Ht 63.0 in | Wt 152.0 lb

## 2016-02-20 DIAGNOSIS — I2510 Atherosclerotic heart disease of native coronary artery without angina pectoris: Secondary | ICD-10-CM

## 2016-02-20 DIAGNOSIS — I10 Essential (primary) hypertension: Secondary | ICD-10-CM

## 2016-02-20 DIAGNOSIS — Z720 Tobacco use: Secondary | ICD-10-CM

## 2016-02-20 NOTE — Progress Notes (Signed)
Cardiology Office Note   Date:  02/20/2016   ID:  Jocelyn Richard, DOB 12-Jan-1966, MRN [id number]  [facility]  Cardiologist: Inis SizerKoneswaran/  Isais Klipfel, NP   Chief Complaint  Patient presents with  . Coronary Artery Disease      History of Present Illness: Jocelyn Richard is a 50 y.o. female who presents for ongoing assessment and management of coronary artery disease, hypertensive heart disease, hyperlipidemia, with recent admission for acute coronary syndrome 02/13/2016, which required PCI and DES to the LAD.cardiac catheterization revealed 90% stenosis mid LAD, PCI to the mid LAD was successfully treated with asynergy drug-eluting stent with no residual stenosis. Her left circumflex ostial lesion was 25% stenosis, the right coronary artery exhibited minimal luminal irregularities. Ejection fraction is 55-65% by visual estimate.  Other history in this patient to include hypertension hyperlipidemia and tobacco abuse.she was taken off of diuretics due to hypotension, and was placed on high potency statin therapy,Plavix 75 mg daily, aspirin, metoprolol 25 mg daily twice a day PPI, and nicotine patches.  Without any cardiac complaints has multiple questions. She is tolerating the new medication regimen without bleeding. She continues to have some mild fatigue. She has not yet been established with cardiac rehabilitation. She has no smoking times one week. She is medically compliant.  Past Medical History  Diagnosis Date  . Hypertensive heart disease   . Stomach ulcer     'had 4; told there were gone w/last endoscopy" (02/13/2016)  . Esophagus, Barrett's   . Anxiety   . GERD (gastroesophageal reflux disease)   . History of hiatal hernia   . Arthritis     "left neck" (02/13/2016)  . CAD (coronary artery disease)     a. 01/2016 Cath/PCI: LM nl, LAD 4631m (3.0x24 Synergy DES), LCX 25ost, RCA min irregs, EF 55-65%.  . Tobacco abuse   . Hyperlipidemia     Past  Surgical History  Procedure Laterality Date  . Cholecystectomy N/A 03/03/2015    Procedure: LAPAROSCOPIC CHOLECYSTECTOMY;  Surgeon: Franky MachoMark Jenkins, MD;  Location: [facility];  Service: General;  Laterality: N/A;  . Colonoscopy with propofol N/A 01/26/2016    Procedure: COLONOSCOPY WITH PROPOFOL;  Surgeon: Corbin Adeobert M Rourk, MD;  Location: [facility];  Service: Endoscopy;  Laterality: N/A;  815  . Esophagogastroduodenoscopy (egd) with propofol N/A 01/26/2016    Procedure: ESOPHAGOGASTRODUODENOSCOPY (EGD) WITH PROPOFOL;  Surgeon: Corbin Adeobert M Rourk, MD;  Location: [facility];  Service: Endoscopy;  Laterality: N/A;  Elease Hashimoto. Maloney dilation N/A 01/26/2016    Procedure: Elease HashimotoMALONEY DILATION;  Surgeon: Corbin Adeobert M Rourk, MD;  Location: [facility];  Service: Endoscopy;  Laterality: N/A;  . Biopsy  01/26/2016    Procedure: BIOPSY;  Surgeon: Corbin Adeobert M Rourk, MD;  Location: [facility];  Service: Endoscopy;;  gastric  . Polypectomy  01/26/2016    Procedure: POLYPECTOMY;  Surgeon: Corbin Adeobert M Rourk, MD;  Location: [facility];  Service: Endoscopy;;  sigmoid colon  . Esophagogastroduodenoscopy  May 2014    [place], Dr. Marva PandaSkulskie: Grade D erosive esophagitis, Z-line irregular and biopsied, hiatal hernia, gastritis, duodenal biopsy negative for celiac. Biopsy comment from [facility] states "if characteristic lesion was identified endoscopically within the GE junction, the histologic findings would fulfill criteria for barrett's.   . Cardiac catheterization N/A 02/13/2016    Procedure: Left Heart Cath and Coronary Angiography;  Surgeon: Corky CraftsJayadeep S Varanasi, MD;  Location: [facility] IN[facility]AS[facility] CV LAB;  Service: Cardiovascular;  Laterality: N/A;  . Cardiac  catheterization  02/13/2016    Procedure: Coronary Stent Intervention;  Surgeon: Corky Crafts, MD;  Location: [facility] INVASIVE CV LAB;  Service: Cardiovascular;;  . Hernia repair  1969    umbilical  . Total abdominal hysterectomy  2014  . Tubal ligation  1990     Current Outpatient  Prescriptions  Medication Sig Dispense Refill  . aspirin EC 81 MG tablet Take 1 tablet (81 mg total) by mouth daily. 30 tablet 1  . atorvastatin (LIPITOR) 80 MG tablet Take 1 tablet (80 mg total) by mouth daily at 6 PM. 30 tablet 6  . clopidogrel (PLAVIX) 75 MG tablet Take 1 tablet (75 mg total) by mouth daily with breakfast. 30 tablet 6  . diazepam (VALIUM) 5 MG tablet Take one tablet by mouth once daily  5  . dicyclomine (BENTYL) 10 MG capsule Take 1 capsule (10 mg total) by mouth 4 (four) times daily -  before meals and at bedtime. 120 capsule 3  . escitalopram (LEXAPRO) 10 MG tablet Take one tablet by mouth once daily  11  . metoprolol tartrate (LOPRESSOR) 25 MG tablet Take 0.5 tablets (12.5 mg total) by mouth 2 (two) times daily. 30 tablet 6  . naphazoline-glycerin (CLEAR EYES) 0.012-0.2 % SOLN Place 1 drop into both eyes daily.    . nicotine (NICODERM CQ - DOSED IN MG/24 HOURS) 14 mg/24hr patch Place 1 patch (14 mg total) onto the skin daily. 30 patch 1  . nitroGLYCERIN (NITROSTAT) 0.4 MG SL tablet Place 1 tablet (0.4 mg total) under the tongue every 5 (five) minutes as needed for chest pain. 30 tablet 12  . pantoprazole (PROTONIX) 40 MG tablet Take 1 tablet (40 mg total) by mouth daily. 30 tablet 6  . [DISCONTINUED] famotidine (PEPCID) 20 MG tablet Take 1 tablet (20 mg total) by mouth 2 (two) times daily. (Patient not taking: Reported on 12/13/2014) 30 tablet 0  . [DISCONTINUED] sucralfate (CARAFATE) 1 G tablet Take 1 tablet (1 g total) by mouth 4 (four) times daily -  with meals and at bedtime. (Patient not taking: Reported on 12/13/2014) 30 tablet 1   No current facility-administered medications for this visit.    Allergies:   Review of patient's allergies indicates no known allergies.    Social History:  The patient  reports that she quit smoking 7 days ago. Her smoking use included Cigarettes. She has a 32 pack-year smoking history. She has never used smokeless tobacco. She reports  that she drinks alcohol. She reports that she does not use illicit drugs.   Family History:  The patient's family history includes Heart attack in her father and mother. There is no history of Colon cancer.    ROS: All other systems are reviewed and negative. Unless otherwise mentioned in H&P    PHYSICAL EXAM: VS:  BP 122/72 mmHg  Pulse 56  Ht  (1.6 m)  Wt 152 lb (68.947 kg)  BMI 26.93 kg/m2  SpO2 98% , BMI Body mass index is 26.93 kg/(m^2). GEN: Well nourished, well developed, in no acute distress HEENT: normal Neck: no JVD, carotid bruits, or masses Cardiac: RRR; no murmurs, rubs, or gallops,no edema  Respiratory:  Clear to auscultation bilaterally, normal work of breathing GI: soft, nontender, nondistended, + BS MS: no deformity or atrophythere is an ecchymosis noted at the right wrist and in the right groin area. No bleeding or bruit is noted on auscultation of the right femoral. Skin: warm and dry, no rash Neuro:  Strength  and sensation are intact Psych: euthymic mood, full affect   Recent Labs: 02/27/2015: ALT 25 02/03/2016: Magnesium 2.1 02/14/2016: BUN 13; Creatinine, Ser 0.75; Hemoglobin 12.6; Platelets 254; Potassium 3.3*; Sodium 140    Lipid Panel    Component Value Date/Time   CHOL 176 02/14/2016 0211   TRIG 132 02/14/2016 0211   HDL 33* 02/14/2016 0211   CHOLHDL 5.3 02/14/2016 0211   VLDL 26 02/14/2016 0211   LDLCALC 117* 02/14/2016 0211      Wt Readings from Last 3 Encounters:  02/20/16 152 lb (68.947 kg)  02/15/16 151 lb 14.4 oz (68.901 kg)  02/02/16 160 lb (72.576 kg)    ASSESSMENT AND PLAN:  1. CAD: Status post hospitalization after abnormal stress test, sending  her straight to current hospital for catheterization revealing a 90% mid LAD.she underwent PCI with drug-eluting stent. She remains on dual antiplatelet therapy. She is complaining of some heartburn since returning home but has not been taking enteric-coated aspirin. Will change it to  enteric-coated aspirin.she'll continue beta blocker aspirin and statin.  I answered multiple questions [facility] over the heart diagram and given her a copy of her catheterization ulceration. She is being referred to cardiac rehabilitation. She'll see us again in 3 months.  2. Tobacco abuse: she has quit smoking x1 week.she is encouraged on this lifestyle change and to continue his progress.  3. Hypertension:blood pressure is well-controlled currently. No changes in medication regimen.   Current medicines are reviewed at length with the patient today.    Labs/ tests ordered today include:  Orders Placed This Encounter  Procedures  . AMB referral to cardiac rehabilitation     Disposition:   FU with 3 months   Signed, Joni ReiningKathryn Evolette Pendell, NP  02/20/2016 2:39 PM    [facility] [address], [place], [place] [address] Phone: [phone]) [phone]; Fax: [phone]

## 2016-02-20 NOTE — Patient Instructions (Addendum)
Your physician recommends that you schedule a follow-up appointment in: 3 Months with Dr. Koneswaran.   Your physician recommends that you continue on your current medications as directed. Please refer to the Current Medication list given to you today.  You have been referred to Cardiac Rehab   If you need a refill on your cardiac medications before your next appointment, please call your pharmacy.  Thank you for choosing [facility]!     

## 2016-02-20 NOTE — Progress Notes (Signed)
Name: Jocelyn CahillDenise W Richard    DOB: [birth year]  Age: 50 y.o.  MR#: [id number]       [facility]  [facility]      Insurance: Payor: BLUE CROSS BLUE SHIELD / Plan: BCBS OTHER / Product Type: *No Product type* /   CC:   No chief complaint on file.   VS Filed Vitals:   02/20/16 1334  BP: 122/72  Pulse: 56  Height: 5\' 3"  (1.6 m)  Weight: 152 lb (68.947 kg)  SpO2: 98%    Weights Current Weight  02/20/16 152 lb (68.947 kg)  02/15/16 151 lb 14.4 oz (68.901 kg)  02/02/16 160 lb (72.576 kg)    Blood Pressure  BP Readings from Last 3 Encounters:  02/20/16 122/72  02/15/16 124/85  02/03/16 100/68     Admit date:  (Not on file) Last encounter with RMR:  Visit date not found   Allergy Review of patient's allergies indicates no known allergies.  Current Outpatient Prescriptions  Medication Sig Dispense Refill  . aspirin EC 81 MG tablet Take 1 tablet (81 mg total) by mouth daily. 30 tablet 1  . atorvastatin (LIPITOR) 80 MG tablet Take 1 tablet (80 mg total) by mouth daily at 6 PM. 30 tablet 6  . clopidogrel (PLAVIX) 75 MG tablet Take 1 tablet (75 mg total) by mouth daily with breakfast. 30 tablet 6  . diazepam (VALIUM) 5 MG tablet Take one tablet by mouth once daily  5  . dicyclomine (BENTYL) 10 MG capsule Take 1 capsule (10 mg total) by mouth 4 (four) times daily -  before meals and at bedtime. 120 capsule 3  . escitalopram (LEXAPRO) 10 MG tablet Take one tablet by mouth once daily  11  . metoprolol tartrate (LOPRESSOR) 25 MG tablet Take 0.5 tablets (12.5 mg total) by mouth 2 (two) times daily. 30 tablet 6  . naphazoline-glycerin (CLEAR EYES) 0.012-0.2 % SOLN Place 1 drop into both eyes daily.    . nicotine (NICODERM CQ - DOSED IN MG/24 HOURS) 14 mg/24hr patch Place 1 patch (14 mg total) onto the skin daily. 30 patch 1  . nitroGLYCERIN (NITROSTAT) 0.4 MG SL tablet Place 1 tablet (0.4 mg total) under the tongue every 5 (five) minutes as needed for chest pain. 30 tablet 12  .  pantoprazole (PROTONIX) 40 MG tablet Take 1 tablet (40 mg total) by mouth daily. 30 tablet 6  . [DISCONTINUED] famotidine (PEPCID) 20 MG tablet Take 1 tablet (20 mg total) by mouth 2 (two) times daily. (Patient not taking: Reported on 12/13/2014) 30 tablet 0  . [DISCONTINUED] sucralfate (CARAFATE) 1 G tablet Take 1 tablet (1 g total) by mouth 4 (four) times daily -  with meals and at bedtime. (Patient not taking: Reported on 12/13/2014) 30 tablet 1   No current facility-administered medications for this visit.    Discontinued Meds:   There are no discontinued medications.  Patient Active Problem List   Diagnosis Date Noted  . Hypertensive heart disease 02/14/2016  . Hyperlipidemia 02/14/2016  . CAD (coronary artery disease) 02/14/2016  . Hypokalemia 02/14/2016  . ACS (acute coronary syndrome) (HCC) 02/13/2016  . Angina pectoris (HCC)   . Chest pain 02/02/2016  . Barrett esophagus 02/02/2016  . Tobacco abuse 02/02/2016  . Essential hypertension 02/02/2016  . Chest pain, rule out acute myocardial infarction 02/02/2016  . Dysphagia   . History of colonic polyps   . Chronic diarrhea 01/22/2016  . GERD (gastroesophageal reflux disease) 01/22/2016  LABS    Component Value Date/Time   NA 140 02/14/2016 0211   NA 137 02/02/2016 1342   NA 136 01/22/2016 1315   NA 138 02/07/2013 1248   K 3.3* 02/14/2016 0211   K 3.4* 02/02/2016 1342   K 3.5 01/22/2016 1315   K 3.7 02/07/2013 1248   CL 109 02/14/2016 0211   CL 103 02/02/2016 1342   CL 104 01/22/2016 1315   CL 105 02/07/2013 1248   CO2 26 02/14/2016 0211   CO2 28 02/02/2016 1342   CO2 25 01/22/2016 1315   CO2 27 02/07/2013 1248   GLUCOSE 117* 02/14/2016 0211   GLUCOSE 101* 02/02/2016 1342   GLUCOSE 83 01/22/2016 1315   GLUCOSE 102* 02/07/2013 1248   BUN 13 02/14/2016 0211   BUN 13 02/02/2016 1342   BUN 11 01/22/2016 1315   BUN 14 02/07/2013 1248   CREATININE 0.75 02/14/2016 0211   CREATININE 0.76 02/13/2016 1501    CREATININE 0.72 02/02/2016 1342   CREATININE 0.89 02/07/2013 1248   CALCIUM 8.8* 02/14/2016 0211   CALCIUM 9.3 02/02/2016 1342   CALCIUM 9.5 01/22/2016 1315   CALCIUM 9.1 02/07/2013 1248   GFRNONAA >60 02/14/2016 0211   GFRNONAA >60 02/13/2016 1501   GFRNONAA >60 02/02/2016 1342   GFRNONAA >60 02/07/2013 1248   GFRAA >60 02/14/2016 0211   GFRAA >60 02/13/2016 1501   GFRAA >60 02/02/2016 1342   GFRAA >60 02/07/2013 1248   CMP     Component Value Date/Time   NA 140 02/14/2016 0211   NA 138 02/07/2013 1248   K 3.3* 02/14/2016 0211   K 3.7 02/07/2013 1248   CL 109 02/14/2016 0211   CL 105 02/07/2013 1248   CO2 26 02/14/2016 0211   CO2 27 02/07/2013 1248   GLUCOSE 117* 02/14/2016 0211   GLUCOSE 102* 02/07/2013 1248   BUN 13 02/14/2016 0211   BUN 14 02/07/2013 1248   CREATININE 0.75 02/14/2016 0211   CREATININE 0.89 02/07/2013 1248   CALCIUM 8.8* 02/14/2016 0211   CALCIUM 9.1 02/07/2013 1248   PROT 6.9 02/27/2015 1400   ALBUMIN 4.3 02/27/2015 1400   AST 24 02/27/2015 1400   ALT 25 02/27/2015 1400   ALKPHOS 98 02/27/2015 1400   BILITOT 0.7 02/27/2015 1400   GFRNONAA >60 02/14/2016 0211   GFRNONAA >60 02/07/2013 1248   GFRAA >60 02/14/2016 0211   GFRAA >60 02/07/2013 1248       Component Value Date/Time   WBC 9.1 02/14/2016 0211   WBC 8.7 02/13/2016 1501   WBC 9.2 02/02/2016 1342   HGB 12.6 02/14/2016 0211   HGB 13.4 02/13/2016 1501   HGB 15.4* 02/02/2016 1342   HGB 12.3 02/13/2013 0743   HGB 15.6 02/07/2013 1248   HCT 39.2 02/14/2016 0211   HCT 41.4 02/13/2016 1501   HCT 45.6 02/02/2016 1342   MCV 97.8 02/14/2016 0211   MCV 94.7 02/13/2016 1501   MCV 93.8 02/02/2016 1342    Lipid Panel     Component Value Date/Time   CHOL 176 02/14/2016 0211   TRIG 132 02/14/2016 0211   HDL 33* 02/14/2016 0211   CHOLHDL 5.3 02/14/2016 0211   VLDL 26 02/14/2016 0211   LDLCALC 117* 02/14/2016 0211    ABG No results found for: PHART, PCO2ART, PO2ART, HCO3, TCO2,  ACIDBASEDEF, O2SAT   No results found for: TSH BNP (last 3 results) No results for input(s): BNP in the last 8760 hours.  ProBNP (last 3 results) No results for  input(s): PROBNP in the last 8760 hours.  Cardiac Panel (last 3 results) No results for input(s): CKTOTAL, CKMB, TROPONINI, RELINDX in the last 72 hours.  Iron/TIBC/Ferritin/ %Sat No results found for: IRON, TIBC, FERRITIN, IRONPCTSAT   EKG Orders placed or performed during the hospital encounter of 02/13/16  . EKG 12-Lead  . EKG 12-Lead  . EKG     Prior Assessment and Plan Problem List as of 02/20/2016      Cardiovascular and Mediastinum   Essential hypertension   Angina pectoris [facility])   ACS (acute coronary syndrome) (HCC)   Hypertensive heart disease   CAD (coronary artery disease)     Digestive   Barrett esophagus   Chronic diarrhea   Last Assessment & Plan 01/22/2016 Office Visit Edited 01/25/2016  8:53 PM by Nira Retort, NP    Present prior to cholecystectomy. Likely IBS. Low-volume hematochezia likely benign anorectal source; she has never had a colonoscopy. Will proceed with lower GI evaluation at time of EGD.   Proceed with TCS with Dr. Jena Gauss in near future: the risks, benefits, and alternatives have been discussed with the patient in detail. The patient states understanding and desires to proceed. PROPOFOL due to polypharmacy Bentyl QID. Not a candidate for Viberzi due to post-cholecystectomy Check celiac panel Return after procedure      GERD (gastroesophageal reflux disease)   Last Assessment & Plan 01/22/2016 Office Visit Written 01/25/2016  8:51 PM by Nira Retort, NP    50 year old female with chronic GERD, reported history of Barrett's (with last EGD in 2014 at [facility] but not available at time of procedure) with refractory GERD and epigastric pain despite omeprazole 40 mg daily. Solid food and liquid dysphagia noted. Significant psychosocial stressors noted. Needs EGD for dyspepsia and Barrett's  surveillance now. Will need to obtain procedure notes from [place]. May benefit from different PPI.   Proceed with upper endoscopy +/- dilation in the near future with Dr. Jena Gauss. The risks, benefits, and alternatives have been discussed in detail with patient. They have stated understanding and desire to proceed.  PROPOFOL due to polypharmacy Retrieve procedure notes       Dysphagia     Other   History of colonic polyps   Chest pain   Tobacco abuse   Chest pain, rule out acute myocardial infarction   Hyperlipidemia   Hypokalemia       Imaging: Dg Chest 2 View  02/02/2016  CLINICAL DATA:  Mid chest pain on off for 2-3 months. EXAM: CHEST  2 VIEW COMPARISON:  07/28/2014 FINDINGS: The heart size and mediastinal contours are within normal limits. Both lungs are clear. The visualized skeletal structures are unremarkable. IMPRESSION: No active cardiopulmonary disease. Electronically Signed   By: Charlett Nose M.D.   On: 02/02/2016 13:38   Nm Myocar Multi W/spect W/wall Motion / Ef  02/13/2016   Downsloping ST segment depression ST segment depression was noted during stress in the II, III, aVF, V5 and V6 leads. ST depression became significant at end of test, early recovery. Got worse initially to downsloping Near normalized by 21 min recovery  Nuclear scan aborted Pt taken to ER.

## 2016-03-08 ENCOUNTER — Telehealth: Payer: Self-pay

## 2016-03-08 ENCOUNTER — Encounter: Payer: Self-pay | Admitting: Intensive Care

## 2016-03-08 ENCOUNTER — Emergency Department: Payer: BLUE CROSS/BLUE SHIELD

## 2016-03-08 ENCOUNTER — Emergency Department
Admission: EM | Admit: 2016-03-08 | Discharge: 2016-03-08 | Disposition: A | Discharge status: Home / Self Care | Payer: BLUE CROSS/BLUE SHIELD | Attending: Emergency Medicine | Admitting: Emergency Medicine

## 2016-03-08 DIAGNOSIS — R0789 Other chest pain: Secondary | ICD-10-CM | POA: Insufficient documentation

## 2016-03-08 DIAGNOSIS — I10 Essential (primary) hypertension: Secondary | ICD-10-CM | POA: Diagnosis not present

## 2016-03-08 DIAGNOSIS — I2510 Atherosclerotic heart disease of native coronary artery without angina pectoris: Secondary | ICD-10-CM | POA: Insufficient documentation

## 2016-03-08 DIAGNOSIS — Z79899 Other long term (current) drug therapy: Secondary | ICD-10-CM | POA: Diagnosis not present

## 2016-03-08 DIAGNOSIS — M1990 Unspecified osteoarthritis, unspecified site: Secondary | ICD-10-CM | POA: Insufficient documentation

## 2016-03-08 DIAGNOSIS — Z87891 Personal history of nicotine dependence: Secondary | ICD-10-CM | POA: Insufficient documentation

## 2016-03-08 DIAGNOSIS — R0602 Shortness of breath: Secondary | ICD-10-CM | POA: Diagnosis not present

## 2016-03-08 DIAGNOSIS — Z7982 Long term (current) use of aspirin: Secondary | ICD-10-CM | POA: Diagnosis not present

## 2016-03-08 DIAGNOSIS — E785 Hyperlipidemia, unspecified: Secondary | ICD-10-CM | POA: Diagnosis not present

## 2016-03-08 DIAGNOSIS — R079 Chest pain, unspecified: Secondary | ICD-10-CM

## 2016-03-08 LAB — CBC WITH DIFFERENTIAL/PLATELET
BASOS ABS: 0.1 10*3/uL (ref 0–0.1)
Basophils Relative: 1 %
EOS PCT: 1 %
Eosinophils Absolute: 0.1 10*3/uL (ref 0–0.7)
HCT: 39.2 % (ref 35.0–47.0)
Hemoglobin: 13.8 g/dL (ref 12.0–16.0)
LYMPHS PCT: 27 %
Lymphs Abs: 2.3 10*3/uL (ref 1.0–3.6)
MCH: 32.4 pg (ref 26.0–34.0)
MCHC: 35.2 g/dL (ref 32.0–36.0)
MCV: 92.1 fL (ref 80.0–100.0)
MONO ABS: 0.7 10*3/uL (ref 0.2–0.9)
Monocytes Relative: 8 %
Neutro Abs: 5.5 10*3/uL (ref 1.4–6.5)
Neutrophils Relative %: 63 %
PLATELETS: 248 10*3/uL (ref 150–440)
RBC: 4.25 MIL/uL (ref 3.80–5.20)
RDW: 13.4 % (ref 11.5–14.5)
WBC: 8.7 10*3/uL (ref 3.6–11.0)

## 2016-03-08 LAB — BASIC METABOLIC PANEL
ANION GAP: 8 (ref 5–15)
BUN: 18 mg/dL (ref 6–20)
CALCIUM: 9.4 mg/dL (ref 8.9–10.3)
CO2: 22 mmol/L (ref 22–32)
CREATININE: 0.8 mg/dL (ref 0.44–1.00)
Chloride: 108 mmol/L (ref 101–111)
GFR calc Af Amer: >60 mL/min (ref >60)
GLUCOSE: 110 mg/dL — AB (ref 65–99)
Potassium: 3.9 mmol/L (ref 3.5–5.1)
Sodium: 138 mmol/L (ref 135–145)

## 2016-03-08 LAB — TROPONIN I
Troponin I: <0.03 ng/mL (ref <0.03)
Troponin I: <0.03 ng/mL (ref <0.03)

## 2016-03-08 MED ORDER — ONDANSETRON HCL 4 MG/2ML IJ SOLN
INTRAMUSCULAR | Status: AC
  Filled 2016-03-08: qty 2

## 2016-03-08 MED ORDER — KETOROLAC TROMETHAMINE 30 MG/ML IJ SOLN
30.0000 mg | Freq: Once | INTRAMUSCULAR | Status: AC
  Administered 2016-03-08: 30 mg via INTRAVENOUS

## 2016-03-08 MED ORDER — KETOROLAC TROMETHAMINE 30 MG/ML IJ SOLN
INTRAMUSCULAR | Status: AC
  Administered 2016-03-08: 30 mg via INTRAVENOUS
  Filled 2016-03-08: qty 1

## 2016-03-08 MED ORDER — GI COCKTAIL ~~LOC~~
30.0000 mL | Freq: Once | ORAL | Status: AC
  Administered 2016-03-08: 30 mL via ORAL

## 2016-03-08 MED ORDER — GI COCKTAIL ~~LOC~~
ORAL | Status: AC
  Administered 2016-03-08: 30 mL via ORAL
  Filled 2016-03-08: qty 30

## 2016-03-08 MED ORDER — NITROGLYCERIN 0.4 MG SL SUBL
0.4000 mg | SUBLINGUAL_TABLET | SUBLINGUAL | Status: DC | PRN
  Administered 2016-03-08: 0.4 mg via SUBLINGUAL

## 2016-03-08 MED ORDER — NITROGLYCERIN 0.4 MG SL SUBL
SUBLINGUAL_TABLET | SUBLINGUAL | Status: AC
  Administered 2016-03-08: 0.4 mg via SUBLINGUAL
  Filled 2016-03-08: qty 2

## 2016-03-08 MED ORDER — ONDANSETRON HCL 4 MG/2ML IJ SOLN
4.0000 mg | Freq: Once | INTRAMUSCULAR | Status: AC
  Administered 2016-03-08: 4 mg via INTRAVENOUS

## 2016-03-08 NOTE — ED Provider Notes (Signed)
[facility] Emergency Department Provider Note    ____________________________________________   I have reviewed the triage vital signs and the nursing notes.   HISTORY  Chief Complaint Chest Pain   History limited by: Not Limited   HPI Jocelyn Richard is a 50 y.o. female with history of coronary artery disease who presents to the emergency department today because of concerns for chest pain. The patient states that the chest pain started today at work. It is located in the center and left chest. It was a tightness. It was associated with some shortness of breath and nausea. The pain is similar to the pain she was feeling last month. Patient did undergo PCI on June 30 with a drug-eluting stent placed for a 90% stenosis of the LAD.She states that she had been doing okay since then, has had a couple episodes of pain in the past week which she had attributed to going back to work.    Past Medical History:  Diagnosis Date  . Anxiety   . Arthritis    "left neck" (02/13/2016)  . CAD (coronary artery disease)    a. 01/2016 Cath/PCI: LM nl, LAD 42m (3.0x24 Synergy DES), LCX 25ost, RCA min irregs, EF 55-65%.  . Esophagus, Barrett's   . GERD (gastroesophageal reflux disease)   . History of hiatal hernia   . Hyperlipidemia   . Hypertensive heart disease   . Stomach ulcer    'had 4; told there were gone w/last endoscopy" (02/13/2016)  . Tobacco abuse     Patient Active Problem List   Diagnosis Date Noted  . Hypertensive heart disease 02/14/2016  . Hyperlipidemia 02/14/2016  . CAD (coronary artery disease) 02/14/2016  . Hypokalemia 02/14/2016  . ACS (acute coronary syndrome) (HCC) 02/13/2016  . Angina pectoris (HCC)   . Chest pain 02/02/2016  . Barrett esophagus 02/02/2016  . Tobacco abuse 02/02/2016  . Essential hypertension 02/02/2016  . Chest pain, rule out acute myocardial infarction 02/02/2016  . Dysphagia   . History of colonic polyps   . Chronic  diarrhea 01/22/2016  . GERD (gastroesophageal reflux disease) 01/22/2016    Past Surgical History:  Procedure Laterality Date  . BIOPSY  01/26/2016   Procedure: BIOPSY;  Surgeon: Corbin Ade, MD;  [facility];  Service: Endoscopy;;  gastric  . CARDIAC CATHETERIZATION N/A 02/13/2016   Procedure: Left Heart Cath and Coronary Angiography;  Surgeon: Corky Crafts, MD;  [facility] INVASIVE CV LAB;  Service: Cardiovascular;  Laterality: N/A;  . CARDIAC CATHETERIZATION  02/13/2016   Procedure: Coronary Stent Intervention;  Surgeon: Corky Crafts, MD;  Location: [facility] INVASIVE CV LAB;  Service: Cardiovascular;;  . CHOLECYSTECTOMY N/A 03/03/2015   Procedure: LAPAROSCOPIC CHOLECYSTECTOMY;  Surgeon: Franky Macho, MD;  Location: [facility] ORS;  Service: General;  Laterality: N/A;  . COLONOSCOPY WITH PROPOFOL N/A 01/26/2016   Procedure: COLONOSCOPY WITH PROPOFOL;  Surgeon: Corbin Ade, MD;  [facility];  Service: Endoscopy;  Laterality: N/A;  815  . ESOPHAGOGASTRODUODENOSCOPY  May 2014   [place], Dr. Marva Panda: Grade D erosive esophagitis, Z-line irregular and biopsied, hiatal hernia, gastritis, duodenal biopsy negative for celiac. Biopsy comment from [facility] states "if characteristic lesion was identified endoscopically within the GE junction, the histologic findings would fulfill criteria for barrett's.   . ESOPHAGOGASTRODUODENOSCOPY (EGD) WITH PROPOFOL N/A 01/26/2016   Procedure: ESOPHAGOGASTRODUODENOSCOPY (EGD) WITH PROPOFOL;  Surgeon: Corbin Ade, MD;  [facility];  Service: Endoscopy;  Laterality: N/A;  .  HERNIA REPAIR  1969   umbilical  . MALONEY DILATION N/A 01/26/2016   Procedure: Elease Hashimoto DILATION;  Surgeon: Corbin Ade, MD;  [facility];  Service: Endoscopy;  Laterality: N/A;  . POLYPECTOMY  01/26/2016   Procedure: POLYPECTOMY;  Surgeon: Corbin Ade, MD;  [facility];  Service: Endoscopy;;  sigmoid colon  . TOTAL ABDOMINAL  HYSTERECTOMY  2014  . TUBAL LIGATION  1990    Current Outpatient Rx  . Order #: [id number] Class: Print  . Order #: [id number] Class: Normal  . Order #: [id number] Class: Normal  . Order #: [id number] Class: Historical Med  . Order #: [id number] Class: Normal  . Order #: [id number] Class: Historical Med  . Order #: [id number] Class: Normal  . Order #: [id number] Class: Historical Med  . Order #: [id number] Class: Normal  . Order #: [id number] Class: Print  . Order #: [id number] Class: Normal    Allergies Review of patient's allergies indicates no known allergies.  Family History  Problem Relation Age of Onset  . Colon cancer Neg Hx   . Heart attack Father   . Heart attack Mother     Social History Social History  Substance Use Topics  . Smoking status: Former Smoker    Packs/day: 1.00    Years: 32.00    Types: Cigarettes    Quit date: 02/13/2016  . Smokeless tobacco: Never Used  . Alcohol use 0.0 oz/week     Comment: 02/13/2016 "a few drinks/year"    Review of Systems  Constitutional: Negative for fever. Cardiovascular: positive for chest pain. Respiratory: positive for shortness of breath. Gastrointestinal: Negative for abdominal pain, vomiting and diarrhea. Neurological: Negative for headaches, focal weakness or numbness.  10-point ROS otherwise negative.  ____________________________________________   PHYSICAL EXAM:  VITAL SIGNS: ED Triage Vitals  Enc Vitals Group     BP      Pulse      Resp      Temp      Temp src      SpO2      Weight      Height      Head Circumference      Peak Flow      Pain Score      Pain Loc      Pain Edu?      Excl. in GC?      Constitutional: Alert and oriented. Well appearing and in no distress. Eyes: Conjunctivae are normal. PERRL. Normal extraocular movements. ENT   Head: Normocephalic and atraumatic.   Nose: No congestion/rhinnorhea.   Mouth/Throat: Mucous membranes are moist.   Neck: No  stridor. Hematological/Lymphatic/Immunilogical: No cervical lymphadenopathy. Cardiovascular: Normal rate, regular rhythm.  No murmurs, rubs, or gallops. Respiratory: Normal respiratory effort without tachypnea nor retractions. Breath sounds are clear and equal bilaterally. No wheezes/rales/rhonchi. Gastrointestinal: Soft and nontender. No distention. There is no CVA tenderness. Genitourinary: Deferred Musculoskeletal: Normal range of motion in all extremities. No joint effusions.  No lower extremity tenderness nor edema. Neurologic:  Normal speech and language. No gross focal neurologic deficits are appreciated.  Skin:  Skin is warm, dry and intact. No rash noted. Psychiatric: Mood and affect are normal. Speech and behavior are normal. Patient exhibits appropriate insight and judgment.  ____________________________________________    LABS (pertinent positives/negatives)  Labs Reviewed  BASIC METABOLIC PANEL - Abnormal; Notable for the following:       Result Value   Glucose, Bld 110 (*)    All other components within normal limits  CBC WITH DIFFERENTIAL/PLATELET  TROPONIN I  TROPONIN I     ____________________________________________   EKG  I, Phineas Semen, attending physician, personally viewed and interpreted this EKG  EKG Time: 0848 Rate: 57 Rhythm: sinus bradycardia Axis: normal Intervals: qtc 421 QRS: narrow ST changes: no st elevation Impression: sinus bradycardia otherwise normal ekg   ____________________________________________    RADIOLOGY  CXR IMPRESSION: Bandlike opacities right lung base favored to represent atelectasis.  ____________________________________________   PROCEDURES  Procedures  ____________________________________________   INITIAL IMPRESSION / ASSESSMENT AND PLAN / ED COURSE  Pertinent labs & imaging results that were available during my care of the patient were reviewed by me and considered in my medical decision making  (see chart for details).  Patient presents to the emergency department today because of concerns for chest pain. Recent PCI with drug-eluting stent placement.  Clinical Course   2 sets of troponin negative. Patient does feel better. Discussed with Dr. Royann Shivers with [name] cardiology. Feels safe for discharge home, will help arrange follow up for patient.  ____________________________________________   FINAL CLINICAL IMPRESSION(S) / ED DIAGNOSES  Final diagnoses:  Nonspecific chest pain     Note: This dictation was prepared with Dragon dictation. Any transcriptional errors that result from this process are unintentional    Phineas Semen, MD 03/08/16 1511

## 2016-03-08 NOTE — Discharge Instructions (Signed)
Please seek medical attention for any high fevers, chest pain, shortness of breath, change in behavior, persistent vomiting, bloody stool or any other new or concerning symptoms.  

## 2016-03-08 NOTE — Telephone Encounter (Signed)
Pt called to schedule an appointment with Dr. Purvis Sheffield, but he does not have any openings so we offered the patient an appointment with Ronie Spies, PA. She was seen in [facility] this morning and is still having chest pains. She is not having any SOB. I did advise her that if her chest pains get any worse then the Ed is always an option. She agreed that she would go to ED if her symptoms get any worse. She is to see Ronie Spies on Thursday @ 2 her in [place].

## 2016-03-08 NOTE — ED Triage Notes (Addendum)
PAtient arrived by EMS from work. PAtient had a stent placed on June 30th. Patient c/o of same chest pain as when she had before stent placed. Reports was standing at computer when chest pain started. Patient is experiencing substernal chest pain and pain under L breast. Patient administered 2 nitro and 81 aspirin before calling EMS. EMS administered 1 nitro and 324 nitro.

## 2016-03-10 DIAGNOSIS — I2510 Atherosclerotic heart disease of native coronary artery without angina pectoris: Secondary | ICD-10-CM | POA: Insufficient documentation

## 2016-03-10 NOTE — Progress Notes (Signed)
Cardiology Office Note    Date:  03/11/2016  ID:  Jocelyn Richard, Jocelyn Richard 07-Jun-1966, MRN [id number] PCP:  [facility]  Cardiologist:  Will be Purvis Sheffield per Lovey Newcomer note  Chief Complaint: f/u chest pain  History of Present Illness:  Jocelyn Richard is a 50 y.o. female with history of CAD ([place] s/p PCI/DES to LAD 01/2016), HTN, hypertensive heart disease, HLD, tobacco abuse, Barrett's esophagus, h/o stomach ulcer who presents for f/u ER visit. She was admitted in June after developing CP/ST changes during a stress test. She underwent LHC s/p DES to LAD with otherwise 25% oCx, luminal irreg of RCA, LVEF 55-65%. Postprocedure, she continued to complain of moderate substernal chest discomfort without associated symptoms. ECG remained normal. Repeat troponins remained normal. Her chest pain eventually subsided and she was discharged home. She followed up with Joni Reining 02/20/16 and was doing well.   However, she has since developed recurrent chest pain. She was seen in the ER 03/08/16 for chest pain associated with diaphoresis and feeling hot. She was at work carrying heavy meat trays. She felt like her neck and shoulders went numb. This lasted several minutes. It scared her because it felt like the same pain that prompted her original stress test. Troponins were negative. CXR with atelectasis, otherwise neg. Other labs linclude BMET with glucose 110, normal CBC. She was advised to f/u as OP. She continues to have intermittent chest pain. It can happen both at rest as well as with exertion (carrying heavy meat trays at work). No syncope, LEE, orthopnea. Nothing makes it better - it eases off spontaneously. She says it is difficult to tell but there are features similar to prior angina but also similar to prior reflux/stomach issues in the past as well.    Past Medical History:  Diagnosis Date  . Anxiety   . Arthritis    "left neck" (02/13/2016)  . CAD (coronary artery disease)    a. 01/2016 Cath/PCI: LM nl, LAD 65m (3.0x24 Synergy DES), LCX 25ost, RCA min irregs, EF 55-65%.  . Esophagus, Barrett's   . GERD (gastroesophageal reflux disease)   . History of hiatal hernia   . Hyperlipidemia   . Hypertension   . Hypertensive heart disease   . Stomach ulcer    'had 4; told there were gone w/last endoscopy" (02/13/2016)  . Tobacco abuse     Past Surgical History:  Procedure Laterality Date  . BIOPSY  01/26/2016   Procedure: BIOPSY;  Surgeon: Corbin Ade, MD;  [facility];  Service: Endoscopy;;  gastric  . CARDIAC CATHETERIZATION N/A 02/13/2016   Procedure: Left Heart Cath and Coronary Angiography;  Surgeon: Corky Crafts, MD;  Location: [facility] INVASIVE CV LAB;  Service: Cardiovascular;  Laterality: N/A;  . CARDIAC CATHETERIZATION  02/13/2016   Procedure: Coronary Stent Intervention;  Surgeon: Corky Crafts, MD;  Location: [facility] INVASIVE CV LAB;  Service: Cardiovascular;;  . CHOLECYSTECTOMY N/A 03/03/2015   Procedure: LAPAROSCOPIC CHOLECYSTECTOMY;  Surgeon: Franky Macho, MD;  Location: [facility] OR[facility];  Service: General;  Laterality: N/A;  . COLONOSCOPY WITH PROPOFOL N/A 01/26/2016   Procedure: COLONOSCOPY WITH PROPOFOL;  Surgeon: Corbin Ade, MD;  Location[facility];  Service: Endoscopy;  Laterality: N/A;  815  . ESOPHAGOGASTRODUODENOSCOPY  May 2014   [place], Dr. Marva Panda: Grade D erosive esophagitis, Z-line irregular and biopsied, hiatal hernia, gastritis, duodenal biopsy negative for celiac. Biopsy comment from [facility] states "if characteristic lesion was identified endoscopically within the GE  junction, the histologic findings would fulfill criteria for barrett's.   . ESOPHAGOGASTRODUODENOSCOPY (EGD) WITH PROPOFOL N/A 01/26/2016   Procedure: ESOPHAGOGASTRODUODENOSCOPY (EGD) WITH PROPOFOL;  Surgeon: Corbin Ade, MD;  [facility];  Service: Endoscopy;  Laterality: N/A;  . HERNIA REPAIR  1969   umbilical  . MALONEY DILATION N/A 01/26/2016    Procedure: Elease Hashimoto DILATION;  Surgeon: Corbin Ade, MD;  [facility];  Service: Endoscopy;  Laterality: N/A;  . POLYPECTOMY  01/26/2016   Procedure: POLYPECTOMY;  Surgeon: Corbin Ade, MD;  [facility];  Service: Endoscopy;;  sigmoid colon  . TOTAL ABDOMINAL HYSTERECTOMY  2014  . TUBAL LIGATION  1990    Current Medications: Outpatient Medications Prior to Visit  Medication Sig Dispense Refill  . aspirin EC 81 MG tablet Take 1 tablet (81 mg total) by mouth daily. 30 tablet 1  . atorvastatin (LIPITOR) 80 MG tablet Take 1 tablet (80 mg total) by mouth daily at 6 PM. 30 tablet 6  . clopidogrel (PLAVIX) 75 MG tablet Take 1 tablet (75 mg total) by mouth daily with breakfast. 30 tablet 6  . diazepam (VALIUM) 5 MG tablet Take one tablet by mouth once daily  5  . dicyclomine (BENTYL) 10 MG capsule Take 1 capsule (10 mg total) by mouth 4 (four) times daily -  before meals and at bedtime. 120 capsule 3  . escitalopram (LEXAPRO) 10 MG tablet Take one tablet by mouth once daily  11  . metoprolol tartrate (LOPRESSOR) 25 MG tablet Take 0.5 tablets (12.5 mg total) by mouth 2 (two) times daily. 30 tablet 6  . naphazoline-glycerin (CLEAR EYES) 0.012-0.2 % SOLN Place 1 drop into both eyes daily.    . nicotine (NICODERM CQ - DOSED IN MG/24 HOURS) 14 mg/24hr patch Place 1 patch (14 mg total) onto the skin daily. 30 patch 1  . nitroGLYCERIN (NITROSTAT) 0.4 MG SL tablet Place 1 tablet (0.4 mg total) under the tongue every 5 (five) minutes as needed for chest pain. 30 tablet 12  . pantoprazole (PROTONIX) 40 MG tablet Take 1 tablet (40 mg total) by mouth daily. 30 tablet 6   No facility-administered medications prior to visit.      Allergies:   Review of patient's allergies indicates no known allergies.   Social History   Social History  . Marital status: Divorced    Spouse name: N/A  . Number of children: N/A  . Years of education: N/A   Social History Main Topics  . Smoking  status: Former Smoker    Packs/day: 1.00    Years: 32.00    Types: Cigarettes    Quit date: 02/13/2016  . Smokeless tobacco: Never Used     Comment: patient states she had not smoked since June 30th, 2017  . Alcohol use 0.0 oz/week     Comment: 02/13/2016 "a few drinks/year"  . Drug use: No  . Sexual activity: Yes    Birth control/ protection: Surgical   Other Topics Concern  . None   Social History Narrative  . None     Family History:  The patient's family history includes Heart attack in her father and mother.   ROS:   Please see the history of present illness.  All other systems are reviewed and otherwise negative.    PHYSICAL EXAM:   VS:  BP 112/70   Pulse (!) 53   Ht 5\' 3"  (1.6 m)   Wt 157 lb (71.2 kg)  SpO2 97%   BMI 27.81 kg/m   BMI: Body mass index is 27.81 kg/m. GEN: Well nourished, well developed WF, in no acute distress  HEENT: normocephalic, atraumatic Neck: no JVD, carotid bruits, or masses Cardiac: RRR; no murmurs, rubs, or gallops, no edema  Respiratory:  clear to auscultation bilaterally, normal work of breathing GI: soft, nontender, nondistended, + BS MS: no deformity or atrophy  Skin: warm and dry, no rash Neuro:  Alert and Oriented x 3, Strength and sensation are intact, follows commands Psych: euthymic mood, full affect  Wt Readings from Last 3 Encounters:  03/11/16 157 lb (71.2 kg)  02/20/16 152 lb (68.9 kg)  02/15/16 151 lb 14.4 oz (68.9 kg)      Studies/Labs Reviewed:   EKG:  EKG was ordered today and personally reviewed by me and demonstrates  SB 54bpm, otherwise unremarkable  Recent Labs: 02/03/2016: Magnesium 2.1 03/08/2016: BUN 18; Creatinine, Ser 0.80; Hemoglobin 13.8; Platelets 248; Potassium 3.9; Sodium 138   Lipid Panel    Component Value Date/Time   CHOL 176 02/14/2016 0211   TRIG 132 02/14/2016 0211   HDL 33 (L) 02/14/2016 0211   CHOLHDL 5.3 02/14/2016 0211   VLDL 26 02/14/2016 0211   LDLCALC 117 (H) 02/14/2016 0211      Additional studies/ records that were reviewed today include: Summarized above.    ASSESSMENT & PLAN:   1. Chest pain - mixed typical/atypical features. Although there are features that remind her of prior stomach issues, she is also concerned because this feels like the symptoms that prompted her original stress test. I d/w Dr. Wyline Mood - we feel that definitive relook cath is indicated to lay the issue to rest. (This is her 3rd encounter now discussing recurrent chest pain - initially post PCI, then ER visit, and now office visit.) Risks and benefits of cardiac catheterization have been discussed with the patient. These include bleeding, infection, kidney damage, stroke, heart attack, death. The patient understands these risks and is willing to proceed. She had recent BMET/CBC that were acceptable in prep for cath. Have ordered required baseline PT/INR for AM of cath. If LHC is unrevealing, she already has planned follow-up with GI in the next several weeks. Continue PPI. Consider addition of carafate empirically if cath is negative. 2. CAD - see above. Reports compliance with ASA, Plavix, BB, statin. 3. HTN - controlled. 4. Hyperlipidemia - continue statin for now. Consider arranging f/u LFTs/lipids when she returns for her next follow-up. 5. Tobacco abuse - she has QUIT!!! Congratulated her on this tremendous accomplishment and encouraged continued abstinence.  Disposition: Tentatively recommend f/u with Joni Reining or me 2-3 weeks post-cath.   Medication Adjustments/Labs and Tests Ordered: Current medicines are reviewed at length with the patient today.  Concerns regarding medicines are outlined above. Medication changes, Labs and Tests ordered today are summarized above and listed in the Patient Instructions accessible in Encounters.   Signed, Ronie Spies PA-C  03/11/2016 2:04 PM    [facility] [place] Location in [facility] [address] [place], [place] [address] Ph: [phone]; Fax [phone]

## 2016-03-11 ENCOUNTER — Ambulatory Visit (INDEPENDENT_AMBULATORY_CARE_PROVIDER_SITE_OTHER): Payer: BLUE CROSS/BLUE SHIELD | Admitting: Physician Assistant

## 2016-03-11 ENCOUNTER — Encounter (HOSPITAL_COMMUNITY): Payer: Self-pay | Admitting: Cardiology

## 2016-03-11 ENCOUNTER — Encounter: Payer: Self-pay | Admitting: *Deleted

## 2016-03-11 ENCOUNTER — Encounter: Payer: Self-pay | Admitting: Physician Assistant

## 2016-03-11 VITALS — BP 112/70 | HR 53 | Ht 63.0 in | Wt 157.0 lb

## 2016-03-11 DIAGNOSIS — I10 Essential (primary) hypertension: Secondary | ICD-10-CM

## 2016-03-11 DIAGNOSIS — I2510 Atherosclerotic heart disease of native coronary artery without angina pectoris: Secondary | ICD-10-CM

## 2016-03-11 DIAGNOSIS — Z720 Tobacco use: Secondary | ICD-10-CM

## 2016-03-11 DIAGNOSIS — R079 Chest pain, unspecified: Secondary | ICD-10-CM

## 2016-03-11 DIAGNOSIS — E785 Hyperlipidemia, unspecified: Secondary | ICD-10-CM | POA: Diagnosis not present

## 2016-03-11 NOTE — Patient Instructions (Signed)
Your physician recommends that you schedule a follow-up appointment after your Cath  Your physician recommends that you continue on your current medications as directed. Please refer to the Current Medication list given to you today.  If you need a refill on your cardiac medications before your next appointment, please call your pharmacy.  Thank you for choosing [facility]!

## 2016-03-12 ENCOUNTER — Ambulatory Visit (HOSPITAL_COMMUNITY)
Admission: RE | Admit: 2016-03-12 | Discharge: 2016-03-12 | Disposition: A | Discharge status: Home / Self Care | Payer: BLUE CROSS/BLUE SHIELD | Source: Ambulatory Visit | Attending: Cardiology | Admitting: Cardiology

## 2016-03-12 ENCOUNTER — Encounter (HOSPITAL_COMMUNITY)
Admission: RE | Disposition: A | Discharge status: Home / Self Care | Payer: Self-pay | Source: Ambulatory Visit | Attending: Cardiology

## 2016-03-12 DIAGNOSIS — K2270 Barrett's esophagus without dysplasia: Secondary | ICD-10-CM | POA: Diagnosis not present

## 2016-03-12 DIAGNOSIS — I25110 Atherosclerotic heart disease of native coronary artery with unstable angina pectoris: Secondary | ICD-10-CM

## 2016-03-12 DIAGNOSIS — E785 Hyperlipidemia, unspecified: Secondary | ICD-10-CM | POA: Insufficient documentation

## 2016-03-12 DIAGNOSIS — Z7902 Long term (current) use of antithrombotics/antiplatelets: Secondary | ICD-10-CM | POA: Diagnosis not present

## 2016-03-12 DIAGNOSIS — Z955 Presence of coronary angioplasty implant and graft: Secondary | ICD-10-CM | POA: Diagnosis not present

## 2016-03-12 DIAGNOSIS — K219 Gastro-esophageal reflux disease without esophagitis: Secondary | ICD-10-CM | POA: Diagnosis not present

## 2016-03-12 DIAGNOSIS — Z8719 Personal history of other diseases of the digestive system: Secondary | ICD-10-CM | POA: Diagnosis not present

## 2016-03-12 DIAGNOSIS — I25118 Atherosclerotic heart disease of native coronary artery with other forms of angina pectoris: Secondary | ICD-10-CM | POA: Insufficient documentation

## 2016-03-12 DIAGNOSIS — F419 Anxiety disorder, unspecified: Secondary | ICD-10-CM | POA: Insufficient documentation

## 2016-03-12 DIAGNOSIS — Z87891 Personal history of nicotine dependence: Secondary | ICD-10-CM | POA: Diagnosis not present

## 2016-03-12 DIAGNOSIS — M1990 Unspecified osteoarthritis, unspecified site: Secondary | ICD-10-CM | POA: Insufficient documentation

## 2016-03-12 DIAGNOSIS — Z8249 Family history of ischemic heart disease and other diseases of the circulatory system: Secondary | ICD-10-CM | POA: Diagnosis not present

## 2016-03-12 DIAGNOSIS — I119 Hypertensive heart disease without heart failure: Secondary | ICD-10-CM | POA: Insufficient documentation

## 2016-03-12 DIAGNOSIS — R079 Chest pain, unspecified: Secondary | ICD-10-CM

## 2016-03-12 DIAGNOSIS — Z7982 Long term (current) use of aspirin: Secondary | ICD-10-CM | POA: Diagnosis not present

## 2016-03-12 DIAGNOSIS — Z9861 Coronary angioplasty status: Secondary | ICD-10-CM

## 2016-03-12 DIAGNOSIS — I2510 Atherosclerotic heart disease of native coronary artery without angina pectoris: Secondary | ICD-10-CM

## 2016-03-12 HISTORY — PX: CARDIAC CATHETERIZATION: SHX172

## 2016-03-12 LAB — PROTIME-INR
INR: 1.02
Prothrombin Time: 13.4 seconds (ref 11.4–15.2)

## 2016-03-12 SURGERY — LEFT HEART CATH AND CORONARY ANGIOGRAPHY

## 2016-03-12 MED ORDER — HEPARIN (PORCINE) IN NACL 2-0.9 UNIT/ML-% IJ SOLN
INTRAMUSCULAR | Status: DC | PRN
  Administered 2016-03-12: 11:00:00

## 2016-03-12 MED ORDER — IOPAMIDOL (ISOVUE-370) INJECTION 76%
INTRAVENOUS | Status: DC | PRN
  Administered 2016-03-12: 45 mL

## 2016-03-12 MED ORDER — FENTANYL CITRATE (PF) 100 MCG/2ML IJ SOLN
INTRAMUSCULAR | Status: DC | PRN
  Administered 2016-03-12: 25 ug via INTRAVENOUS

## 2016-03-12 MED ORDER — SODIUM CHLORIDE 0.9% FLUSH: 3.0000 mL | INTRAVENOUS | Status: DC | PRN

## 2016-03-12 MED ORDER — LIDOCAINE HCL (PF) 1 % IJ SOLN
INTRAMUSCULAR | Status: AC
  Filled 2016-03-12: qty 30

## 2016-03-12 MED ORDER — FAMOTIDINE IN NACL 20-0.9 MG/50ML-% IV SOLN
INTRAVENOUS | Status: AC
  Administered 2016-03-12: 20 mg via INTRAVENOUS
  Filled 2016-03-12: qty 50

## 2016-03-12 MED ORDER — LIDOCAINE HCL (PF) 1 % IJ SOLN
INTRAMUSCULAR | Status: DC | PRN
  Administered 2016-03-12: 15 mL via SUBCUTANEOUS

## 2016-03-12 MED ORDER — ASPIRIN 81 MG PO CHEW: 81.0000 mg | CHEWABLE_TABLET | ORAL | Status: DC

## 2016-03-12 MED ORDER — SODIUM CHLORIDE 0.9% FLUSH: 3.0000 mL | Freq: Two times a day (BID) | INTRAVENOUS | Status: DC

## 2016-03-12 MED ORDER — MIDAZOLAM HCL 2 MG/2ML IJ SOLN
INTRAMUSCULAR | Status: DC | PRN
  Administered 2016-03-12: 2 mg via INTRAVENOUS

## 2016-03-12 MED ORDER — ONDANSETRON HCL 4 MG/2ML IJ SOLN: 4.0000 mg | Freq: Four times a day (QID) | INTRAMUSCULAR | Status: DC | PRN

## 2016-03-12 MED ORDER — ACETAMINOPHEN 325 MG PO TABS: 650.0000 mg | ORAL_TABLET | ORAL | Status: DC | PRN

## 2016-03-12 MED ORDER — MORPHINE SULFATE (PF) 2 MG/ML IV SOLN
2.0000 mg | Freq: Once | INTRAVENOUS | Status: AC
Start: 2016-03-12 — End: 2016-03-12
  Administered 2016-03-12: 2 mg via INTRAVENOUS

## 2016-03-12 MED ORDER — IOPAMIDOL (ISOVUE-370) INJECTION 76%
INTRAVENOUS | Status: AC
  Filled 2016-03-12: qty 100

## 2016-03-12 MED ORDER — NITROGLYCERIN 0.4 MG SL SUBL
0.4000 mg | SUBLINGUAL_TABLET | SUBLINGUAL | Status: DC | PRN
  Administered 2016-03-12: 0.4 mg via SUBLINGUAL

## 2016-03-12 MED ORDER — SODIUM CHLORIDE 0.9 % IV SOLN: 250.0000 mL | INTRAVENOUS | Status: DC | PRN

## 2016-03-12 MED ORDER — SODIUM CHLORIDE 0.9 % IV SOLN: INTRAVENOUS | Status: AC

## 2016-03-12 MED ORDER — ALUM & MAG HYDROXIDE-SIMETH 200-200-20 MG/5ML PO SUSP
ORAL | Status: AC
  Administered 2016-03-12: 15 mL via ORAL
  Filled 2016-03-12: qty 30

## 2016-03-12 MED ORDER — MIDAZOLAM HCL 2 MG/2ML IJ SOLN
INTRAMUSCULAR | Status: AC
  Filled 2016-03-12: qty 2

## 2016-03-12 MED ORDER — FAMOTIDINE IN NACL 20-0.9 MG/50ML-% IV SOLN
20.0000 mg | Freq: Once | INTRAVENOUS | Status: AC
  Administered 2016-03-12: 20 mg via INTRAVENOUS

## 2016-03-12 MED ORDER — NITROGLYCERIN 0.4 MG SL SUBL
SUBLINGUAL_TABLET | SUBLINGUAL | Status: AC
  Filled 2016-03-12: qty 1

## 2016-03-12 MED ORDER — SODIUM CHLORIDE 0.9 % WEIGHT BASED INFUSION: 1.0000 mL/kg/h | INTRAVENOUS | Status: DC

## 2016-03-12 MED ORDER — FENTANYL CITRATE (PF) 100 MCG/2ML IJ SOLN
INTRAMUSCULAR | Status: AC
  Filled 2016-03-12: qty 2

## 2016-03-12 MED ORDER — ALUM & MAG HYDROXIDE-SIMETH 200-200-20 MG/5ML PO SUSP
15.0000 mL | Freq: Once | ORAL | Status: AC
  Administered 2016-03-12: 15 mL via ORAL

## 2016-03-12 MED ORDER — HEPARIN (PORCINE) IN NACL 2-0.9 UNIT/ML-% IJ SOLN
INTRAMUSCULAR | Status: AC
  Filled 2016-03-12: qty 1000

## 2016-03-12 MED ORDER — SODIUM CHLORIDE 0.9 % WEIGHT BASED INFUSION
3.0000 mL/kg/h | INTRAVENOUS | Status: AC
  Administered 2016-03-12: 3 mL/kg/h via INTRAVENOUS

## 2016-03-12 MED ORDER — MORPHINE SULFATE (PF) 2 MG/ML IV SOLN
INTRAVENOUS | Status: AC
  Filled 2016-03-12: qty 1

## 2016-03-12 SURGICAL SUPPLY — 6 items
CATH INFINITI 5FR MULTPACK ANG (CATHETERS) ×2 IMPLANT
KIT HEART LEFT (KITS) ×3 IMPLANT
PACK CARDIAC CATHETERIZATION (CUSTOM PROCEDURE TRAY) ×3 IMPLANT
SHEATH PINNACLE 5F 10CM (SHEATH) ×2 IMPLANT
TRANSDUCER W/STOPCOCK (MISCELLANEOUS) ×3 IMPLANT
WIRE EMERALD 3MM-J .035X150CM (WIRE) ×2 IMPLANT

## 2016-03-12 NOTE — Progress Notes (Signed)
Client called me into room and c/o 8/10 chest pain; O2 on at 2l/min; 12 lead EKG done

## 2016-03-12 NOTE — H&P (View-Only) (Signed)
 Cardiology Office Note    Date:  03/11/2016  ID:  Jocelyn Richard, DOB 12/01/1965, MRN [id number] PCP:  [facility]  Cardiologist:  Will be Koneswaran per [facility] note  Chief Complaint: f/u chest pain  History of Present Illness:  Jocelyn Richard is a 49 y.o. female with history of CAD ([place] s/p PCI/DES to LAD 01/2016), HTN, hypertensive heart disease, HLD, tobacco abuse, Barrett's esophagus, h/o stomach ulcer who presents for f/u ER visit. She was admitted in June after developing CP/ST changes during a stress test. She underwent LHC s/p DES to LAD with otherwise 25% oCx, luminal irreg of RCA, LVEF 55-65%. Postprocedure, she continued to complain of moderate substernal chest discomfort without associated symptoms. ECG remained normal. Repeat troponins remained normal. Her chest pain eventually subsided and she was discharged home. She followed up with Kathryn Lawrence 02/20/16 and was doing well.   However, she has since developed recurrent chest pain. She was seen in the ER 03/08/16 for chest pain associated with diaphoresis and feeling hot. She was at work carrying heavy meat trays. She felt like her neck and shoulders went numb. This lasted several minutes. It scared her because it felt like the same pain that prompted her original stress test. Troponins were negative. CXR with atelectasis, otherwise neg. Other labs linclude BMET with glucose 110, normal CBC. She was advised to f/u as OP. She continues to have intermittent chest pain. It can happen both at rest as well as with exertion (carrying heavy meat trays at work). No syncope, LEE, orthopnea. Nothing makes it better - it eases off spontaneously. She says it is difficult to tell but there are features similar to prior angina but also similar to prior reflux/stomach issues in the past as well.    Past Medical History:  Diagnosis Date  . Anxiety   . Arthritis    "left neck" (02/13/2016)  . CAD (coronary artery disease)    a. 01/2016 Cath/PCI: LM nl, LAD 90m (3.0x24 Synergy DES), LCX 25ost, RCA min irregs, EF 55-65%.  . Esophagus, Barrett's   . GERD (gastroesophageal reflux disease)   . History of hiatal hernia   . Hyperlipidemia   . Hypertension   . Hypertensive heart disease   . Stomach ulcer    'had 4; told there were gone w/last endoscopy" (02/13/2016)  . Tobacco abuse     Past Surgical History:  Procedure Laterality Date  . BIOPSY  01/26/2016   Procedure: BIOPSY;  Surgeon: Robert M Rourk, MD;  [facility]: [facility];  Service: Endoscopy;;  gastric  . CARDIAC CATHETERIZATION N/A 02/13/2016   Procedure: Left Heart Cath and Coronary Angiography;  Surgeon: Jayadeep S Varanasi, MD;  Location: [facility];  Service: Cardiovascular;  Laterality: N/A;  . CARDIAC CATHETERIZATION  02/13/2016   Procedure: Coronary Stent Intervention;  Surgeon: Jayadeep S Varanasi, MD;  Location: [facility];  Service: Cardiovascular;;  . CHOLECYSTECTOMY N/A 03/03/2015   Procedure: LAPAROSCOPIC CHOLECYSTECTOMY;  Surgeon: Mark Jenkins, MD;  Location: [facility] ORS;  Service: General;  Laterality: N/A;  . COLONOSCOPY WITH PROPOFOL N/A 01/26/2016   Procedure: COLONOSCOPY WITH PROPOFOL;  Surgeon: Robert M Rourk, MD;  Location: [facility];  Service: Endoscopy;  Laterality: N/A;  815  . ESOPHAGOGASTRODUODENOSCOPY  May 2014   [place], Dr. [place]: Grade D erosive esophagitis, Z-line irregular and biopsied, hiatal hernia, gastritis, duodenal biopsy negative for celiac. Biopsy comment from [facility] states "if characteristic lesion was identified endoscopically within the GE   junction, the histologic findings would fulfill criteria for barrett's.   . ESOPHAGOGASTRODUODENOSCOPY (EGD) WITH PROPOFOL N/A 01/26/2016   Procedure: ESOPHAGOGASTRODUODENOSCOPY (EGD) WITH PROPOFOL;  Surgeon: Robert M Rourk, MD;  [facility];  Service: Endoscopy;  Laterality: N/A;  . HERNIA REPAIR  1969   umbilical  . MALONEY DILATION N/A 01/26/2016    Procedure: MALONEY DILATION;  Surgeon: Robert M Rourk, MD;  [facility];  Service: Endoscopy;  Laterality: N/A;  . POLYPECTOMY  01/26/2016   Procedure: POLYPECTOMY;  Surgeon: Robert M Rourk, MD;  Location: [facility];  Service: Endoscopy;;  sigmoid colon  . TOTAL ABDOMINAL HYSTERECTOMY  2014  . TUBAL LIGATION  1990    Current Medications: Outpatient Medications Prior to Visit  Medication Sig Dispense Refill  . aspirin EC 81 MG tablet Take 1 tablet (81 mg total) by mouth daily. 30 tablet 1  . atorvastatin (LIPITOR) 80 MG tablet Take 1 tablet (80 mg total) by mouth daily at 6 PM. 30 tablet 6  . clopidogrel (PLAVIX) 75 MG tablet Take 1 tablet (75 mg total) by mouth daily with breakfast. 30 tablet 6  . diazepam (VALIUM) 5 MG tablet Take one tablet by mouth once daily  5  . dicyclomine (BENTYL) 10 MG capsule Take 1 capsule (10 mg total) by mouth 4 (four) times daily -  before meals and at bedtime. 120 capsule 3  . escitalopram (LEXAPRO) 10 MG tablet Take one tablet by mouth once daily  11  . metoprolol tartrate (LOPRESSOR) 25 MG tablet Take 0.5 tablets (12.5 mg total) by mouth 2 (two) times daily. 30 tablet 6  . naphazoline-glycerin (CLEAR EYES) 0.012-0.2 % SOLN Place 1 drop into both eyes daily.    . nicotine (NICODERM CQ - DOSED IN MG/24 HOURS) 14 mg/24hr patch Place 1 patch (14 mg total) onto the skin daily. 30 patch 1  . nitroGLYCERIN (NITROSTAT) 0.4 MG SL tablet Place 1 tablet (0.4 mg total) under the tongue every 5 (five) minutes as needed for chest pain. 30 tablet 12  . pantoprazole (PROTONIX) 40 MG tablet Take 1 tablet (40 mg total) by mouth daily. 30 tablet 6   No facility-administered medications prior to visit.      Allergies:   Review of patient's allergies indicates no known allergies.   Social History   Social History  . Marital status: Divorced    Spouse name: N/A  . Number of children: N/A  . Years of education: N/A   Social History Main Topics  . Smoking  status: Former Smoker    Packs/day: 1.00    Years: 32.00    Types: Cigarettes    Quit date: 02/13/2016  . Smokeless tobacco: Never Used     Comment: patient states she had not smoked since June 30th, 2017  . Alcohol use 0.0 oz/week     Comment: 02/13/2016 "a few drinks/year"  . Drug use: No  . Sexual activity: Yes    Birth control/ protection: Surgical   Other Topics Concern  . None   Social History Narrative  . None     Family History:  The patient's family history includes Heart attack in her father and mother.   ROS:   Please see the history of present illness.  All other systems are reviewed and otherwise negative.    PHYSICAL EXAM:   VS:  BP 112/70   Pulse (!) 53   Ht 5' 3" (1.6 m)   Wt 157 lb (71.2 kg)     SpO2 97%   BMI 27.81 kg/m   BMI: Body mass index is 27.81 kg/m. GEN: Well nourished, well developed WF, in no acute distress  HEENT: normocephalic, atraumatic Neck: no JVD, carotid bruits, or masses Cardiac: RRR; no murmurs, rubs, or gallops, no edema  Respiratory:  clear to auscultation bilaterally, normal work of breathing GI: soft, nontender, nondistended, + BS MS: no deformity or atrophy  Skin: warm and dry, no rash Neuro:  Alert and Oriented x 3, Strength and sensation are intact, follows commands Psych: euthymic mood, full affect  Wt Readings from Last 3 Encounters:  03/11/16 157 lb (71.2 kg)  02/20/16 152 lb (68.9 kg)  02/15/16 151 lb 14.4 oz (68.9 kg)      Studies/Labs Reviewed:   EKG:  EKG was ordered today and personally reviewed by me and demonstrates  SB 54bpm, otherwise unremarkable  Recent Labs: 02/03/2016: Magnesium 2.1 03/08/2016: BUN 18; Creatinine, Ser 0.80; Hemoglobin 13.8; Platelets 248; Potassium 3.9; Sodium 138   Lipid Panel    Component Value Date/Time   CHOL 176 02/14/2016 0211   TRIG 132 02/14/2016 0211   HDL 33 (L) 02/14/2016 0211   CHOLHDL 5.3 02/14/2016 0211   VLDL 26 02/14/2016 0211   LDLCALC 117 (H) 02/14/2016 0211      Additional studies/ records that were reviewed today include: Summarized above.    ASSESSMENT & PLAN:   1. Chest pain - mixed typical/atypical features. Although there are features that remind her of prior stomach issues, she is also concerned because this feels like the symptoms that prompted her original stress test. I d/w Dr. Branch - we feel that definitive relook cath is indicated to lay the issue to rest. (This is her 3rd encounter now discussing recurrent chest pain - initially post PCI, then ER visit, and now office visit.) Risks and benefits of cardiac catheterization have been discussed with the patient. These include bleeding, infection, kidney damage, stroke, heart attack, death. The patient understands these risks and is willing to proceed. She had recent BMET/CBC that were acceptable in prep for cath. Have ordered required baseline PT/INR for AM of cath. If LHC is unrevealing, she already has planned follow-up with GI in the next several weeks. Continue PPI. Consider addition of carafate empirically if cath is negative. 2. CAD - see above. Reports compliance with ASA, Plavix, BB, statin. 3. HTN - controlled. 4. Hyperlipidemia - continue statin for now. Consider arranging f/u LFTs/lipids when she returns for her next follow-up. 5. Tobacco abuse - she has QUIT!!! Congratulated her on this tremendous accomplishment and encouraged continued abstinence.  Disposition: Tentatively recommend f/u with Kathryn Lawrence or me 2-3 weeks post-cath.   Medication Adjustments/Labs and Tests Ordered: Current medicines are reviewed at length with the patient today.  Concerns regarding medicines are outlined above. Medication changes, Labs and Tests ordered today are summarized above and listed in the Patient Instructions accessible in Encounters.   Signed, Rani Idler PA-C  03/11/2016 2:04 PM    [facility] - [place] Location in [facility] [address] [place], [place] [address] Ph: [phone]; Fax [phone] 

## 2016-03-12 NOTE — Discharge Instructions (Signed)
Angiogram, Care After °Refer to this sheet in the next few weeks. These instructions provide you with information about caring for yourself after your procedure. Your health care provider may also give you more specific instructions. Your treatment has been planned according to current medical practices, but problems sometimes occur. Call your health care provider if you have any problems or questions after your procedure. °WHAT TO EXPECT AFTER THE PROCEDURE °After your procedure, it is typical to have the following: °· Bruising at the catheter insertion site that usually fades within 1-2 weeks. °· Blood collecting in the tissue (hematoma) that may be painful to the touch. It should usually decrease in size and tenderness within 1-2 weeks. °HOME CARE INSTRUCTIONS °· Take medicines only as directed by your health care provider. °· You may shower 24-48 hours after the procedure or as directed by your health care provider. Remove the bandage (dressing) and gently wash the site with plain soap and water. Pat the area dry with a clean towel. Do not rub the site, because this may cause bleeding. °· Do not take baths, swim, or use a hot tub until your health care provider approves. °· Check your insertion site every day for redness, swelling, or drainage. °· Do not apply powder or lotion to the site. °· Do not lift over 10 lb (4.5 kg) for 5 days after your procedure or as directed by your health care provider. °· Ask your health care provider when it is okay to: °¨ Return to work or school. °¨ Resume usual physical activities or sports. °¨ Resume sexual activity. °· Do not drive home if you are discharged the same day as the procedure. Have someone else drive you. °· You may drive 24 hours after the procedure unless otherwise instructed by your health care provider. °· Do not operate machinery or power tools for 24 hours after the procedure or as directed by your health care provider. °· If your procedure was done as an  outpatient procedure, which means that you went home the same day as your procedure, a responsible adult should be with you for the first 24 hours after you arrive home. °· Keep all follow-up visits as directed by your health care provider. This is important. °SEEK MEDICAL CARE IF: °· You have a fever. °· You have chills. °· You have increased bleeding from the catheter insertion site. Hold pressure on the site. °SEEK IMMEDIATE MEDICAL CARE IF: °· You have unusual pain at the catheter insertion site. °· You have redness, warmth, or swelling at the catheter insertion site. °· You have drainage (other than a small amount of blood on the dressing) from the catheter insertion site. °· The catheter insertion site is bleeding, and the bleeding does not stop after 30 minutes of holding steady pressure on the site. °· The area near or just beyond the catheter insertion site becomes pale, cool, tingly, or numb. °  °This information is not intended to replace advice given to you by your health care provider. Make sure you discuss any questions you have with your health care provider. °  °Document Released: 02/18/2005 Document Revised: 08/23/2014 Document Reviewed: 01/03/2013 °Elsevier Interactive Patient Education ©2016 [employer] ° °

## 2016-03-12 NOTE — Progress Notes (Signed)
Dr Herbie Baltimore notified of c/o chest burning and med given as ordered

## 2016-03-12 NOTE — Interval H&P Note (Signed)
History and Physical Interval Note:  03/12/2016 10:17 AM  Lorenda Cahill  has presented today for surgery, with the diagnosis of continues resting chest pain with history of recent PCI. Concern is for stent-related complication. The various methods of treatment have been discussed with the patient and family. After consideration of risks, benefits and other options for treatment, the patient has consented to  Procedure(s): Left Heart Cath and Coronary Angiography (N/A) With Possible Acute Coronary Intervention as a surgical intervention .  The patient's history has been reviewed, patient examined, no change in status, stable for surgery.  I have reviewed the patient's chart and labs.  Questions were answered to the patient's satisfaction.    Cath Lab Visit (complete for each Cath Lab visit)  Clinical Evaluation Leading to the Procedure:   ACS: Yes.  --  having 8 out of 10 resting chest pain just prior to transfer to procedure  Non-ACS:    Anginal Classification: CCS IV  Anti-ischemic medical therapy: Minimal Therapy (1 class of medications)  Non-Invasive Test Results: No non-invasive testing performed ; Recent LAD PCI  Prior CABG: No previous CABG  TIMI SCORE  Patient Information:  TIMI Score is 3  UA/NSTEMI and intermediate-risk features (e.g., TIMI score 3?4) for short-term risk of death or nonfatal MI  Revascularization of the presumed culprit artery   A (9)  Indication: 10; Score: 9   Bryan Lemma

## 2016-03-12 NOTE — Progress Notes (Signed)
Please let patient know I reviewed her cath report and am glad everything looked OK. She already has f/u with GI in several weeks - let her know she may want to call them and let them know she's been having chest discomfort deemed noncardiac to find out if they want to make any med adjustments before her next appt with them. Thx! Elhadji Pecore PA-C

## 2016-03-12 NOTE — Progress Notes (Signed)
Site area: Right groin a 5 french arterial sheath was removed  Site Prior to Removal:  Level 0  Pressure Applied For 20 MINUTES    Bedrest Beginning at 1135am  Manual:   Yes.    Patient Status During Pull:  stable  Post Pull Groin Site:  Level 0  Post Pull Instructions Given:  Yes.    Post Pull Pulses Present:  Yes.    Dressing Applied:  Yes.    Comments:  VS remain stable during sheath

## 2016-03-15 ENCOUNTER — Encounter: Payer: Self-pay | Admitting: *Deleted

## 2016-03-15 ENCOUNTER — Encounter (HOSPITAL_COMMUNITY): Payer: Self-pay | Admitting: Cardiology

## 2016-03-15 ENCOUNTER — Encounter (HOSPITAL_COMMUNITY): Payer: Self-pay

## 2016-03-15 ENCOUNTER — Telehealth: Payer: Self-pay | Admitting: Physician Assistant

## 2016-03-15 NOTE — Telephone Encounter (Signed)
Patient had cath on Friday and forgot to ask when she could return to work. / tg

## 2016-03-15 NOTE — Telephone Encounter (Signed)
She may return - just no lifting over 5 lbs for 1 week post-cath (may resume 03/19/16). No sexual activity for 1 week. She should keep procedure site clean & dry. If you notice increased pain, swelling, bleeding or pus, call/return!  You may shower, but no soaking baths/hot tubs/pools for 1 week.  Robben Jagiello PA-C

## 2016-03-15 NOTE — Telephone Encounter (Signed)
Will forward to Ronie Spies, PA-C

## 2016-03-16 NOTE — Telephone Encounter (Signed)
Patient notified and work letter at front desk for pt to pick up.

## 2016-03-19 NOTE — Progress Notes (Signed)
Cardiology Office Note    Date:  03/22/2016  ID:  Jocelyn Richard, Jocelyn Richard 1965/10/05, MRN [id number] PCP:  [facility]  Cardiologist: Will be Purvis Sheffield per Lovey Newcomer note   Chief Complaint: f/u cath  History of Present Illness:  Jocelyn Richard is a 50 y.o. female with history of CAD ([place] s/p PCI/DES to LAD 01/2016), HTN, hypertensive heart disease, HLD, tobacco abuse, Barrett's esophagus, h/o stomach ulcer who presents for f/u ER visit. She was admitted in June after developing CP/ST changes during a stress test. She underwent LHC s/p DES to LAD with otherwise 25% oCx, luminal irreg of RCA, LVEF 55-65%. Postprocedure, she continued to complain of moderate substernal chest discomfort without associated symptoms. ECG remained normal. Repeat troponins remained normal. Her chest pain eventually subsided and she was discharged home. She followed up with Joni Reining 02/20/16 and was doing well. She returned to clinic 03/11/16 complaining of recurrent CP similar to prior to recent MI. Given persistent sx she was referred for relook cath 03/12/16 showing stable angiography - there was a very small caliber ostial D1 that was jailed that could be the culprit although Dr. Herbie Baltimore suspected GI vs anxiety etiology of her pain. LVEDP was normal.  She returns for follow-up feeling well. She has not had any further chest pain. No SOB. She feels when she switched to Protonix it did not work as well so she has been taking Prilosec.  She starts cardiac rehab orientation 8/18.  Past Medical History:  Diagnosis Date  . Anxiety   . Arthritis    "left neck" (02/13/2016)  . CAD (coronary artery disease)    a. 01/2016 Cath/PCI: LM nl, LAD 75m (3.0x24 Synergy DES), LCX 25ost, RCA min irregs, EF 55-65%.  . Esophagus, Barrett's   . GERD (gastroesophageal reflux disease)   . History of hiatal hernia   . Hyperlipidemia   . Hypertension   . Hypertensive heart disease   . Stomach ulcer    'had 4; told there  were gone w/last endoscopy" (02/13/2016)  . Tobacco abuse     Past Surgical History:  Procedure Laterality Date  . BIOPSY  01/26/2016   Procedure: BIOPSY;  Surgeon: Corbin Ade, MD;  [facility];  Service: Endoscopy;;  gastric  . CARDIAC CATHETERIZATION N/A 02/13/2016   Procedure: Left Heart Cath and Coronary Angiography;  Surgeon: Corky Crafts, MD;  Location: [facility] INVASIVE CV LAB;  Service: Cardiovascular;  Laterality: N/A;  . CARDIAC CATHETERIZATION  02/13/2016   Procedure: Coronary Stent Intervention;  Surgeon: Corky Crafts, MD;  Location: [facility] INVASIVE CV LAB;  Service: Cardiovascular;; mLAD 80% - 3.0 x 24 Synergy DES postdilated to 3.3 mm  . CARDIAC CATHETERIZATION N/A 03/12/2016   Procedure: Left Heart Cath and Coronary Angiography;  Surgeon: Marykay Lex, MD;  Location: [facility] INVASIVE CV LAB;  Service: Cardiovascular;  Laterality: N/A;  . CHOLECYSTECTOMY N/A 03/03/2015   Procedure: LAPAROSCOPIC CHOLECYSTECTOMY;  Surgeon: Franky Macho, MD;  Location: [facility];  Service: General;  Laterality: N/A;  . COLONOSCOPY WITH PROPOFOL N/A 01/26/2016   Procedure: COLONOSCOPY WITH PROPOFOL;  Surgeon: Corbin Ade, MD;  [facility];  Service: Endoscopy;  Laterality: N/A;  815  . ESOPHAGOGASTRODUODENOSCOPY  May 2014   [place], Dr. Marva Panda: Grade D erosive esophagitis, Z-line irregular and biopsied, hiatal hernia, gastritis, duodenal biopsy negative for celiac. Biopsy comment from [facility] states "if characteristic lesion was identified endoscopically within the GE junction, the histologic findings would fulfill  criteria for barrett's.   . ESOPHAGOGASTRODUODENOSCOPY (EGD) WITH PROPOFOL N/A 01/26/2016   Procedure: ESOPHAGOGASTRODUODENOSCOPY (EGD) WITH PROPOFOL;  Surgeon: Corbin Ade, MD;  [facility];  Service: Endoscopy;  Laterality: N/A;  . HERNIA REPAIR  1969   umbilical  . MALONEY DILATION N/A 01/26/2016   Procedure: Elease Hashimoto DILATION;  Surgeon: Corbin Ade, MD;  [facility];  Service: Endoscopy;  Laterality: N/A;  . POLYPECTOMY  01/26/2016   Procedure: POLYPECTOMY;  Surgeon: Corbin Ade, MD;  [facility];  Service: Endoscopy;;  sigmoid colon  . TOTAL ABDOMINAL HYSTERECTOMY  2014  . TUBAL LIGATION  1990    Current Medications: Current Outpatient Prescriptions  Medication Sig Dispense Refill  . aspirin EC 81 MG tablet Take 1 tablet (81 mg total) by mouth daily. 30 tablet 1  . atorvastatin (LIPITOR) 80 MG tablet Take 1 tablet (80 mg total) by mouth daily at 6 PM. 30 tablet 6  . clopidogrel (PLAVIX) 75 MG tablet Take 1 tablet (75 mg total) by mouth daily with breakfast. 30 tablet 6  . diazepam (VALIUM) 5 MG tablet Take one tablet by mouth once daily  5  . dicyclomine (BENTYL) 10 MG capsule Take 1 capsule (10 mg total) by mouth 4 (four) times daily -  before meals and at bedtime. 120 capsule 3  . escitalopram (LEXAPRO) 10 MG tablet Take one tablet by mouth once daily  11  . metoprolol tartrate (LOPRESSOR) 25 MG tablet Take 0.5 tablets (12.5 mg total) by mouth 2 (two) times daily. 30 tablet 6  . naphazoline-glycerin (CLEAR EYES) 0.012-0.2 % SOLN Place 1 drop into both eyes daily.    . nicotine (NICODERM CQ - DOSED IN MG/24 HOURS) 14 mg/24hr patch Place 1 patch (14 mg total) onto the skin daily. 30 patch 1  . nitroGLYCERIN (NITROSTAT) 0.4 MG SL tablet Place 1 tablet (0.4 mg total) under the tongue every 5 (five) minutes as needed for chest pain. 30 tablet 12  . omeprazole (PRILOSEC) 40 MG capsule Take 40 mg by mouth daily.    . pantoprazole (PROTONIX) 40 MG tablet Take 1 tablet (40 mg total) by mouth daily. 30 tablet 6   No current facility-administered medications for this visit.      Allergies:   Review of patient's allergies indicates no known allergies.   Social History   Social History  . Marital status: Divorced    Spouse name: N/A  . Number of children: N/A  . Years of education: N/A   Social History  Main Topics  . Smoking status: Former Smoker    Packs/day: 1.00    Years: 32.00    Types: Cigarettes    Quit date: 02/13/2016  . Smokeless tobacco: Never Used     Comment: patient states she had not smoked since June 30th, 2017  . Alcohol use 0.0 oz/week     Comment: 02/13/2016 "a few drinks/year"  . Drug use: No  . Sexual activity: Yes    Birth control/ protection: Surgical   Other Topics Concern  . None   Social History Narrative  . None     Family History:  The patient's family history includes Heart attack in her father and mother.   ROS:   Please see the history of present illness. No bleeding noted. All other systems are reviewed and otherwise negative.    PHYSICAL EXAM:   VS:  BP 122/68   Pulse 77   Ht  (1.6  m)   Wt 152 lb (68.9 kg)   SpO2 99%   BMI 26.93 kg/m   BMI: Body mass index is 26.93 kg/m. GEN: Well nourished, well developed WF, in no acute distress  HEENT: normocephalic, atraumatic Neck: no JVD, carotid bruits, or masses Cardiac: RRR; no murmurs, rubs, or gallops, no edema  Respiratory:  clear to auscultation bilaterally, normal work of breathing GI: soft, nontender, nondistended, + BS MS: no deformity or atrophy  Skin: warm and dry, no rash. Right groin cath site without hematoma or bruit. Moderate palm sized area of resolving ecchymosis.  Neuro:  Alert and Oriented x 3, Strength and sensation are intact, follows commands Psych: euthymic mood, full affect  Wt Readings from Last 3 Encounters:  03/22/16 152 lb (68.9 kg)  03/11/16 157 lb (71.2 kg)  02/20/16 152 lb (68.9 kg)      Studies/Labs Reviewed:   EKG:   EKG was not ordered today.  Recent Labs: 02/03/2016: Magnesium 2.1 03/08/2016: BUN 18; Creatinine, Ser 0.80; Hemoglobin 13.8; Platelets 248; Potassium 3.9; Sodium 138   Lipid Panel    Component Value Date/Time   CHOL 176 02/14/2016 0211   TRIG 132 02/14/2016 0211   HDL 33 (L) 02/14/2016 0211   CHOLHDL 5.3 02/14/2016 0211    VLDL 26 02/14/2016 0211   LDLCALC 117 (H) 02/14/2016 0211    Additional studies/ records that were reviewed today include: Summarized above    ASSESSMENT & PLAN:   1. Chest pain - likely noncardiac. Suspect GI vs anxiety. We discussed the fact that Prilosec can render Plavix less effective. Protonix has not worked as well for her. She has tried Dexilant in the past with good results. Per med review this should not affect Plavix. Will provide rx. She has f/u with GI already next week.  2. CAD - stable by cath. Continue ASA, Plavix, BB, statin. Congratulated her on decision to participate in cardiac rehab. 3. HTN - controlled. 4. Hyperlipidemia - f/u liver function today as baseline given high dose statin use, no recent levels on file. Further monitoring of lipids/liver at discretion of primary cardiologist. 5. Tobacco abuse - she remains abstinent! A huge accomplishment!  Disposition: F/u with Dr. Purvis Sheffield to establish care in 2-3 months.   Medication Adjustments/Labs and Tests Ordered: Current medicines are reviewed at length with the patient today.  Concerns regarding medicines are outlined above. Medication changes, Labs and Tests ordered today are summarized above and listed in the Patient Instructions accessible in Encounters.   Signed, Ronie Spies PA-C  03/22/2016 2:23 PM     [facility] [place] Location in [facility] [address] [place], [place] [address] Ph: [phone]; Fax [phone]

## 2016-03-22 ENCOUNTER — Encounter: Payer: Self-pay | Admitting: Physician Assistant

## 2016-03-22 ENCOUNTER — Other Ambulatory Visit (HOSPITAL_COMMUNITY)
Admission: RE | Admit: 2016-03-22 | Discharge: 2016-03-22 | Disposition: A | Discharge status: Home / Self Care | Payer: BLUE CROSS/BLUE SHIELD | Source: Ambulatory Visit | Attending: Physician Assistant | Admitting: Physician Assistant

## 2016-03-22 ENCOUNTER — Ambulatory Visit (INDEPENDENT_AMBULATORY_CARE_PROVIDER_SITE_OTHER): Payer: BLUE CROSS/BLUE SHIELD | Admitting: Physician Assistant

## 2016-03-22 VITALS — BP 122/68 | HR 77 | Ht 63.0 in | Wt 152.0 lb

## 2016-03-22 DIAGNOSIS — Z720 Tobacco use: Secondary | ICD-10-CM

## 2016-03-22 DIAGNOSIS — E785 Hyperlipidemia, unspecified: Secondary | ICD-10-CM | POA: Insufficient documentation

## 2016-03-22 DIAGNOSIS — I2510 Atherosclerotic heart disease of native coronary artery without angina pectoris: Secondary | ICD-10-CM

## 2016-03-22 DIAGNOSIS — R079 Chest pain, unspecified: Secondary | ICD-10-CM

## 2016-03-22 DIAGNOSIS — I10 Essential (primary) hypertension: Secondary | ICD-10-CM

## 2016-03-22 LAB — HEPATIC FUNCTION PANEL
ALK PHOS: 95 U/L (ref 38–126)
ALT: 53 U/L (ref 14–54)
AST: 30 U/L (ref 15–41)
Albumin: 4.4 g/dL (ref 3.5–5.0)
BILIRUBIN DIRECT: 0.1 mg/dL (ref 0.1–0.5)
BILIRUBIN INDIRECT: 0.4 mg/dL (ref 0.3–0.9)
BILIRUBIN TOTAL: 0.5 mg/dL (ref 0.3–1.2)
TOTAL PROTEIN: 7.4 g/dL (ref 6.5–8.1)

## 2016-03-22 MED ORDER — DEXLANSOPRAZOLE 30 MG PO CPDR: 30.0000 mg | DELAYED_RELEASE_CAPSULE | Freq: Every day | ORAL | 1 refills | Status: DC

## 2016-03-22 NOTE — Patient Instructions (Signed)
Medication Instructions:  STOP PRILOSEC STOP PROTONIX START DEXILANT 30 MG DAILY   Labwork: Your physician recommends that you return for lab work in: TODAY  LFT'S    Testing/Procedures: NONE    Follow-Up: Your physician recommends that you schedule a follow-up appointment in: 2-3 MONTHS WITH DR. Purvis SheffieldKONESWARAN   Any Other Special Instructions Will Be Listed Below (If Applicable).     If you need a refill on your cardiac medications before your next appointment, please call your pharmacy.

## 2016-03-23 ENCOUNTER — Telehealth: Payer: Self-pay

## 2016-03-23 NOTE — Telephone Encounter (Signed)
Called pt.  Left message for pt to return my call.

## 2016-03-23 NOTE — Telephone Encounter (Signed)
-----   Message from Laurann Montanaayna N Dunn, [place] sent at 03/22/2016  4:54 PM EDT ----- Please let patient know LFTs are totally normal. Continue statin at present dose. Dayna Dunn PA-C

## 2016-03-30 ENCOUNTER — Encounter: Payer: Self-pay | Admitting: Gastroenterology

## 2016-03-30 ENCOUNTER — Ambulatory Visit (INDEPENDENT_AMBULATORY_CARE_PROVIDER_SITE_OTHER): Payer: BLUE CROSS/BLUE SHIELD | Admitting: Gastroenterology

## 2016-03-30 VITALS — BP 105/71 | HR 52 | Temp 97.8°F | Ht 63.0 in | Wt 155.0 lb

## 2016-03-30 DIAGNOSIS — K5900 Constipation, unspecified: Secondary | ICD-10-CM | POA: Insufficient documentation

## 2016-03-30 DIAGNOSIS — K2270 Barrett's esophagus without dysplasia: Secondary | ICD-10-CM | POA: Diagnosis not present

## 2016-03-30 MED ORDER — PANTOPRAZOLE SODIUM 40 MG PO TBEC: 40.0000 mg | DELAYED_RELEASE_TABLET | Freq: Every day | ORAL | 3 refills | Status: DC

## 2016-03-30 NOTE — Progress Notes (Signed)
Referring Provider: Samuella BruinMann, Benjamin L, PA-C [facility]  [facility] Primary GI: Dr. Jena Gaussourk   Chief Complaint  Patient presents with  . Follow-up    reflux better    HPI:   Jocelyn Richard is a 50 y.o. female presenting today with a history of Barrett's esophagus and likely IBS, returning in follow-up from EGD and colonoscopy. Next colonoscopy in 10 years, next EGD in 3.   EGD: distal esophagus s/p biopsy, s+barrett's, nodular mucosa s/p biopsy. Empiric dilation. Repeat EGD in 3 years. 3 benign sigmoid polyps, internal hemorrhoids, segmental biopsy negative for microscopic colitis. Next colonoscopy in 10 years.    Recently had a cardiac stent. On Plavix now. Not on a PPI as she wasn't sure what would interfere with Plavix. Now going back to constipation. Took some dulcolax last week. Having a BM about once a week. Quit smoking the day of the stent. No abdominal pain. Overall doing well from a GI standpoint.    Past Medical History:  Diagnosis Date  . Anxiety   . Arthritis    "left neck" (02/13/2016)  . CAD (coronary artery disease)    a. 01/2016 Cath/PCI: LM nl, LAD 4237m (3.0x24 Synergy DES), LCX 25ost, RCA min irregs, EF 55-65%. b. Relook cath 02/2016 with stable angiography; small caliber jailed D1 could be culprit but MD suspected GI vs anxiety as cause for recurrent sx.  . Esophagus, Barrett's   . GERD (gastroesophageal reflux disease)   . History of hiatal hernia   . Hyperlipidemia   . Hypertension   . Hypertensive heart disease   . Stomach ulcer    'had 4; told there were gone w/last endoscopy" (02/13/2016)  . Tobacco abuse     Past Surgical History:  Procedure Laterality Date  . BIOPSY  01/26/2016   Procedure: BIOPSY;  Surgeon: Corbin Adeobert M Rourk, MD;  [facility];  Service: Endoscopy;;  gastric  . CARDIAC CATHETERIZATION N/A 02/13/2016   Procedure: Left Heart Cath and Coronary Angiography;  Surgeon: Corky CraftsJayadeep S Varanasi, MD;   [facility];  Service: Cardiovascular;  Laterality: N/A;  . CARDIAC CATHETERIZATION  02/13/2016   Procedure: Coronary Stent Intervention;  Surgeon: Corky CraftsJayadeep S Varanasi, MD;  [facility];  Service: Cardiovascular;; mLAD 80% - 3.0 x 24 Synergy DES postdilated to 3.3 mm  . CARDIAC CATHETERIZATION N/A 03/12/2016   Procedure: Left Heart Cath and Coronary Angiography;  Surgeon: Marykay Lexavid W Harding, MD;  [facility];  Service: Cardiovascular;  Laterality: N/A;  . CHOLECYSTECTOMY N/A 03/03/2015   Procedure: LAPAROSCOPIC CHOLECYSTECTOMY;  Surgeon: Franky MachoMark Jenkins, MD;  Location: [facility];  Service: General;  Laterality: N/A;  . COLONOSCOPY WITH PROPOFOL N/A 01/26/2016    3 benign sigmoid polyps, internal hemorrhoids, segmental biopsy negative for microscopic colitis. Next colonoscopy in 10 years.   . ESOPHAGOGASTRODUODENOSCOPY  May 2014   [place], Dr. Marva PandaSkulsk[place]: Grade D erosive esophagitis, Z-line irregular and biopsied, hiatal hernia, gastritis, duodenal biopsy negative for celiac. Biopsy comment from [facility] states "if characteristic lesion was identified endoscopically within the GE junction, the histologic findings would fulfill criteria for barrett's.   . ESOPHAGOGASTRODUODENOSCOPY (EGD) WITH PROPOFOL N/A 01/26/2016   distal esophagus s/p biopsy, s+barrett's, nodular mucosa s/p biopsy. Empiric dilation. Repeat EGD in 3 years  . HERNIA REPAIR  1969   umbilical  . MALONEY DILATION N/A 01/26/2016   Procedure: Elease HashimotoMALONEY DILATION;  Surgeon: Corbin Adeobert M Rourk, MD;  [facility];  Service: Endoscopy;  Laterality: N/A;  . POLYPECTOMY  01/26/2016   Procedure: POLYPECTOMY;  Surgeon: Corbin Adeobert M Rourk, MD;  [facility];  Service: Endoscopy;;  sigmoid colon  . TOTAL ABDOMINAL HYSTERECTOMY  2014  . TUBAL LIGATION  1990    Current Outpatient Prescriptions  Medication Sig Dispense Refill  . aspirin EC 81 MG tablet Take 1 tablet (81 mg total) by mouth daily. 30  tablet 1  . atorvastatin (LIPITOR) 80 MG tablet Take 1 tablet (80 mg total) by mouth daily at 6 PM. 30 tablet 6  . clopidogrel (PLAVIX) 75 MG tablet Take 1 tablet (75 mg total) by mouth daily with breakfast. 30 tablet 6  . diazepam (VALIUM) 5 MG tablet Take one tablet by mouth once daily  5  . escitalopram (LEXAPRO) 10 MG tablet Take one tablet by mouth once daily  11  . metoprolol tartrate (LOPRESSOR) 25 MG tablet Take 0.5 tablets (12.5 mg total) by mouth 2 (two) times daily. 30 tablet 6  . naphazoline-glycerin (CLEAR EYES) 0.012-0.2 % SOLN Place 1 drop into both eyes daily.    . nicotine (NICODERM CQ - DOSED IN MG/24 HOURS) 14 mg/24hr patch Place 1 patch (14 mg total) onto the skin daily. (Patient not taking: Reported on 03/30/2016) 30 patch 1  . nitroGLYCERIN (NITROSTAT) 0.4 MG SL tablet Place 1 tablet (0.4 mg total) under the tongue every 5 (five) minutes as needed for chest pain. (Patient not taking: Reported on 03/30/2016) 30 tablet 12  . pantoprazole (PROTONIX) 40 MG tablet Take 1 tablet (40 mg total) by mouth daily. 90 tablet 3   No current facility-administered medications for this visit.     Allergies as of 03/30/2016  . (No Known Allergies)    Family History  Problem Relation Age of Onset  . Heart attack Father   . Heart attack Mother   . Colon cancer Neg Hx     Social History   Social History  . Marital status: Divorced    Spouse name: N/A  . Number of children: N/A  . Years of education: N/A   Social History Main Topics  . Smoking status: Former Smoker    Packs/day: 1.00    Years: 32.00    Types: Cigarettes    Quit date: 02/13/2016  . Smokeless tobacco: Never Used     Comment: patient states she had not smoked since June 30th, 2017  . Alcohol use 0.0 oz/week     Comment: 02/13/2016 "a few drinks/year"  . Drug use: No  . Sexual activity: Yes    Birth control/ protection: Surgical   Other Topics Concern  . None   Social History Narrative  . None    Review  of Systems: As mentioned in HPI   Physical Exam: BP 105/71   Pulse (!) 52   Temp 97.8 F (36.6 C) (Oral)   Ht 5\' 3"  (1.6 m)   Wt 155 lb (70.3 kg)   BMI 27.46 kg/m  General:   Alert and oriented. No distress noted. Pleasant and cooperative.  Head:  Normocephalic and atraumatic. Eyes:  Conjuctiva clear without scleral icterus. Abdomen:  +BS, soft, non-tender and non-distended. No rebound or guarding. No HSM or masses noted. Msk:  Symmetrical without gross deformities. Normal posture. Extremities:  Without edema. Neurologic:  Alert and  oriented x4;  grossly normal neurologically. Psych:  Alert and cooperative. Normal mood and affect.

## 2016-03-30 NOTE — Patient Instructions (Signed)
Take Protonix once each morning, 30 minutes before breakfast. You will need to take this every day for life. I sent this to the pharmacy.  For constipation: I have provided samples called Linzess. Start with the smaller dosage of 72 mcg. Take 1 capsule each morning, 30 minutes before breakfast. If needed, you can do the higher dosage of 145 mcg each morning. Let me know which one you like better, and I will send in to the pharmacy.   We will see you back in 3 months!

## 2016-03-31 NOTE — Assessment & Plan Note (Signed)
Surveillance due in 3 years. Needs PPI. As she is on Plavix, will have her steer clear of omeprazole. Start Protonix once daily. Return in 3 months.

## 2016-03-31 NOTE — Assessment & Plan Note (Signed)
Trial of Linzess. I have provided 72 mcg to trial and 145 mcg. Start with 72 mcg first. Patient to call with which dosing she prefers, and we will provide the prescription. Return in 3 months.

## 2016-04-01 NOTE — Progress Notes (Signed)
cc'ed to pcp °

## 2016-04-02 ENCOUNTER — Encounter (HOSPITAL_COMMUNITY): Payer: BLUE CROSS/BLUE SHIELD

## 2016-04-16 ENCOUNTER — Telehealth: Payer: Self-pay

## 2016-04-16 MED ORDER — LINACLOTIDE 145 MCG PO CAPS: 145.0000 ug | ORAL_CAPSULE | Freq: Every day | ORAL | 3 refills | Status: DC

## 2016-04-16 NOTE — Telephone Encounter (Signed)
Done

## 2016-04-16 NOTE — Telephone Encounter (Signed)
Pt called, left a voicemail, she said the linzess worked better for her and would like an rx sent in.

## 2016-05-26 ENCOUNTER — Ambulatory Visit (INDEPENDENT_AMBULATORY_CARE_PROVIDER_SITE_OTHER): Payer: BLUE CROSS/BLUE SHIELD | Admitting: Cardiovascular Disease

## 2016-05-26 ENCOUNTER — Encounter: Payer: Self-pay | Admitting: Cardiovascular Disease

## 2016-05-26 VITALS — BP 118/76 | HR 66 | Ht 63.0 in | Wt 155.0 lb

## 2016-05-26 DIAGNOSIS — Z9289 Personal history of other medical treatment: Secondary | ICD-10-CM

## 2016-05-26 DIAGNOSIS — F17201 Nicotine dependence, unspecified, in remission: Secondary | ICD-10-CM

## 2016-05-26 DIAGNOSIS — I10 Essential (primary) hypertension: Secondary | ICD-10-CM

## 2016-05-26 DIAGNOSIS — E7800 Pure hypercholesterolemia, unspecified: Secondary | ICD-10-CM | POA: Diagnosis not present

## 2016-05-26 DIAGNOSIS — I25118 Atherosclerotic heart disease of native coronary artery with other forms of angina pectoris: Secondary | ICD-10-CM

## 2016-05-26 NOTE — Patient Instructions (Signed)

## 2016-05-26 NOTE — Progress Notes (Signed)
SUBJECTIVE: Jocelyn Richard is a 50 y.o. female with history of CAD ([place] s/p PCI/DES to LAD 01/2016), HTN, hypertensive heart disease, HLD, tobacco abuse, Barrett's esophagus, and a h/o stomach ulcer.   She was admitted in June 2017 after developing CP/ST changes during a stress test. She underwent LHC s/p DES to LAD with otherwise 25% Cx, luminal irreg of RCA, LVEF 55-65%. Postprocedure, she continued to complain of moderate substernal chest discomfort without associated symptoms. ECG remained normal. Repeat troponins remained normal. Her chest pain eventually subsided and she was discharged home.   She followed up with Joni Reining 02/20/16 and was doing well. She returned to clinic 03/11/16 complaining of recurrent CP similar to prior to recent MI. Given persistent sx she was referred for relook cath 03/12/16 showing stable angiography - there was a very small caliber ostial D1 that was jailed that could be the culprit although Dr. Herbie Baltimore suspected GI vs anxiety etiology of her pain. LVEDP was normal.  Has had to use SL nitro once when being awoken from sleep. Has chronic exertional dyspnea. Quit smoking. Denies leg swelling.  Has a lot of anxiety due to having custody of her 16 and 41 yr old grandsons. Her son is a drug addict and has required hospitalization.     Review of Systems: As per "subjective", otherwise negative.  No Known Allergies  Current Outpatient Prescriptions  Medication Sig Dispense Refill  . aspirin EC 81 MG tablet Take 1 tablet (81 mg total) by mouth daily. 30 tablet 1  . atorvastatin (LIPITOR) 80 MG tablet Take 1 tablet (80 mg total) by mouth daily at 6 PM. 30 tablet 6  . clopidogrel (PLAVIX) 75 MG tablet Take 1 tablet (75 mg total) by mouth daily with breakfast. 30 tablet 6  . diazepam (VALIUM) 5 MG tablet Take one tablet by mouth once daily  5  . escitalopram (LEXAPRO) 10 MG tablet Take one tablet by mouth once daily  11  . linaclotide (LINZESS) 145 MCG  CAPS capsule Take 1 capsule (145 mcg total) by mouth daily before breakfast. 90 capsule 3  . metoprolol tartrate (LOPRESSOR) 25 MG tablet Take 0.5 tablets (12.5 mg total) by mouth 2 (two) times daily. 30 tablet 6  . naphazoline-glycerin (CLEAR EYES) 0.012-0.2 % SOLN Place 1 drop into both eyes daily.    . nicotine (NICODERM CQ - DOSED IN MG/24 HOURS) 14 mg/24hr patch Place 1 patch (14 mg total) onto the skin daily. 30 patch 1  . nitroGLYCERIN (NITROSTAT) 0.4 MG SL tablet Place 1 tablet (0.4 mg total) under the tongue every 5 (five) minutes as needed for chest pain. 30 tablet 12  . pantoprazole (PROTONIX) 40 MG tablet Take 1 tablet (40 mg total) by mouth daily. 90 tablet 3   No current facility-administered medications for this visit.     Past Medical History:  Diagnosis Date  . Anxiety   . Arthritis    "left neck" (02/13/2016)  . CAD (coronary artery disease)    a. 01/2016 Cath/PCI: LM nl, LAD 45m (3.0x24 Synergy DES), LCX 25ost, RCA min irregs, EF 55-65%. b. Relook cath 02/2016 with stable angiography; small caliber jailed D1 could be culprit but MD suspected GI vs anxiety as cause for recurrent sx.  . Esophagus, Barrett's   . GERD (gastroesophageal reflux disease)   . History of hiatal hernia   . Hyperlipidemia   . Hypertension   . Hypertensive heart disease   . Stomach ulcer    '  had 4; told there were gone w/last endoscopy" (02/13/2016)  . Tobacco abuse     Past Surgical History:  Procedure Laterality Date  . BIOPSY  01/26/2016   Procedure: BIOPSY;  Surgeon: Corbin Ade, MD;  [facility];  Service: Endoscopy;;  gastric  . CARDIAC CATHETERIZATION N/A 02/13/2016   Procedure: Left Heart Cath and Coronary Angiography;  Surgeon: Corky Crafts, MD;  Location: [facility] INVASIVE CV LAB;  Service: Cardiovascular;  Laterality: N/A;  . CARDIAC CATHETERIZATION  02/13/2016   Procedure: Coronary Stent Intervention;  Surgeon: Corky Crafts, MD;  Location: [facility] INVASIVE CV LAB;   Service: Cardiovascular;; mLAD 80% - 3.0 x 24 Synergy DES postdilated to 3.3 mm  . CARDIAC CATHETERIZATION N/A 03/12/2016   Procedure: Left Heart Cath and Coronary Angiography;  Surgeon: Marykay Lex, MD;  Location: [facility] INVASIVE CV LAB;  Service: Cardiovascular;  Laterality: N/A;  . CHOLECYSTECTOMY N/A 03/03/2015   Procedure: LAPAROSCOPIC CHOLECYSTECTOMY;  Surgeon: Franky Macho, MD;  Location: [facility];  Service: General;  Laterality: N/A;  . COLONOSCOPY WITH PROPOFOL N/A 01/26/2016    3 benign sigmoid polyps, internal hemorrhoids, segmental biopsy negative for microscopic colitis. Next colonoscopy in 10 years.   . ESOPHAGOGASTRODUODENOSCOPY  May 2014   [place], Dr. Marva Panda: Grade D erosive esophagitis, Z-line irregular and biopsied, hiatal hernia, gastritis, duodenal biopsy negative for celiac. Biopsy comment from [facility] states "if characteristic lesion was identified endoscopically within the GE junction, the histologic findings would fulfill criteria for barrett's.   . ESOPHAGOGASTRODUODENOSCOPY (EGD) WITH PROPOFOL N/A 01/26/2016   distal esophagus s/p biopsy, s+barrett's, nodular mucosa s/p biopsy. Empiric dilation. Repeat EGD in 3 years  . HERNIA REPAIR  1969   umbilical  . MALONEY DILATION N/A 01/26/2016   Procedure: Elease Hashimoto DILATION;  Surgeon: Corbin Ade, MD;  [facility];  Service: Endoscopy;  Laterality: N/A;  . POLYPECTOMY  01/26/2016   Procedure: POLYPECTOMY;  Surgeon: Corbin Ade, MD;  [facility];  Service: Endoscopy;;  sigmoid colon  . TOTAL ABDOMINAL HYSTERECTOMY  2014  . TUBAL LIGATION  1990    Social History   Social History  . Marital status: Divorced    Spouse name: N/A  . Number of children: N/A  . Years of education: N/A   Occupational History  . Not on file.   Social History Main Topics  . Smoking status: Former Smoker    Packs/day: 1.00    Years: 32.00    Types: Cigarettes    Quit date: 02/13/2016  . Smokeless tobacco: Never Used      Comment: patient states she had not smoked since June 30th, 2017  . Alcohol use 0.0 oz/week     Comment: 02/13/2016 "a few drinks/year"  . Drug use: No  . Sexual activity: Yes    Birth control/ protection: Surgical   Other Topics Concern  . Not on file   Social History Narrative  . No narrative on file     Vitals:   05/26/16 0812  BP: 118/76  Pulse: 66  SpO2: 96%  Weight: 155 lb (70.3 kg)  Height: 5\' 3"  (1.6 m)    PHYSICAL EXAM General: NAD HEENT: Normal. Neck: No JVD, no thyromegaly. Lungs: Clear to auscultation bilaterally with normal respiratory effort. CV: Nondisplaced PMI.  Regular rate and rhythm, normal S1/S2, no S3/S4, no murmur. No pretibial or periankle edema.  No carotid bruit.   Abdomen: Soft, nontender, no distention.  Neurologic: Alert and oriented.  Psych:  Normal affect. Skin: Normal. Musculoskeletal: No gross deformities.    ECG: Most recent ECG reviewed.      ASSESSMENT AND PLAN: 1. CAD: Symptomatically stable. Proximal to mid LAD stent. Ostial D1 lesion 80% stenosed jailed by stent. Very small caliber vessel. Continue ASA, Plavix, beta blocker, and statin.  2. HTN: Controlled. No changes.  3. Hyperlipidemia: Continue statin.  4. Tobacco abuse: She remains abstinent.  Dispo: fu 6 months.  Prentice DockerSuresh Lenzy Kerschner, M.D., F.A.C.C.

## 2016-06-30 ENCOUNTER — Ambulatory Visit: Payer: BLUE CROSS/BLUE SHIELD | Admitting: Gastroenterology

## 2016-07-03 ENCOUNTER — Other Ambulatory Visit: Payer: Self-pay | Admitting: Nurse Practitioner

## 2016-08-30 ENCOUNTER — Other Ambulatory Visit: Payer: Self-pay | Admitting: Nurse Practitioner

## 2016-11-08 ENCOUNTER — Other Ambulatory Visit: Payer: Self-pay

## 2016-11-08 MED ORDER — CLOPIDOGREL BISULFATE 75 MG PO TABS: 75.0000 mg | ORAL_TABLET | Freq: Every day | ORAL | 3 refills | Status: DC

## 2016-11-08 NOTE — Telephone Encounter (Signed)
90 day plavix refilled per fax request

## 2016-11-29 ENCOUNTER — Encounter: Payer: Self-pay | Admitting: Cardiovascular Disease

## 2016-11-29 ENCOUNTER — Ambulatory Visit (INDEPENDENT_AMBULATORY_CARE_PROVIDER_SITE_OTHER): Payer: BLUE CROSS/BLUE SHIELD | Admitting: Cardiovascular Disease

## 2016-11-29 VITALS — BP 132/89 | HR 65 | Ht 63.0 in | Wt 164.0 lb

## 2016-11-29 DIAGNOSIS — E7800 Pure hypercholesterolemia, unspecified: Secondary | ICD-10-CM

## 2016-11-29 DIAGNOSIS — I25708 Atherosclerosis of coronary artery bypass graft(s), unspecified, with other forms of angina pectoris: Secondary | ICD-10-CM

## 2016-11-29 DIAGNOSIS — Z720 Tobacco use: Secondary | ICD-10-CM

## 2016-11-29 DIAGNOSIS — I10 Essential (primary) hypertension: Secondary | ICD-10-CM | POA: Diagnosis not present

## 2016-11-29 NOTE — Progress Notes (Signed)
SUBJECTIVE: The patient presents for follow-up of coronary artery disease. She has a history of drug-eluting stent placement to the LAD in June 2017.  She has been under a lot of stress lately as she has had several relatives pass away prematurely.  She has occasional chest pains which she thinks are related to anxiety. She has taken up to 3 nitroglycerin tablets.  She has had some epigastric discomfort associated with nausea.  She started smoking more than a half pack of cigarettes daily.   Review of Systems: As per "subjective", otherwise negative.  No Known Allergies  Current Outpatient Prescriptions  Medication Sig Dispense Refill  . aspirin EC 81 MG tablet Take 1 tablet (81 mg total) by mouth daily. 30 tablet 1  . atorvastatin (LIPITOR) 80 MG tablet TAKE 1 TABLET(80 MG) BY MOUTH DAILY AT 6 PM 30 tablet 6  . clopidogrel (PLAVIX) 75 MG tablet Take 1 tablet (75 mg total) by mouth daily. 90 tablet 3  . escitalopram (LEXAPRO) 10 MG tablet Take one tablet by mouth once daily  11  . linaclotide (LINZESS) 145 MCG CAPS capsule Take 1 capsule (145 mcg total) by mouth daily before breakfast. 90 capsule 3  . metoprolol tartrate (LOPRESSOR) 25 MG tablet TAKE 1/2 TABLET(12.5 MG) BY MOUTH TWICE DAILY 30 tablet 6  . naphazoline-glycerin (CLEAR EYES) 0.012-0.2 % SOLN Place 1 drop into both eyes daily.    . nitroGLYCERIN (NITROSTAT) 0.4 MG SL tablet Place 1 tablet (0.4 mg total) under the tongue every 5 (five) minutes as needed for chest pain. 30 tablet 12  . pantoprazole (PROTONIX) 40 MG tablet Take 1 tablet (40 mg total) by mouth daily. 90 tablet 3  . nicotine (NICODERM CQ - DOSED IN MG/24 HOURS) 14 mg/24hr patch Place 1 patch (14 mg total) onto the skin daily. (Patient not taking: Reported on 11/29/2016) 30 patch 1   No current facility-administered medications for this visit.     Past Medical History:  Diagnosis Date  . Anxiety   . Arthritis    "left neck" (02/13/2016)  . CAD  (coronary artery disease)    a. 01/2016 Cath/PCI: LM nl, LAD 67m (3.0x24 Synergy DES), LCX 25ost, RCA min irregs, EF 55-65%. b. Relook cath 02/2016 with stable angiography; small caliber jailed D1 could be culprit but MD suspected GI vs anxiety as cause for recurrent sx.  . Esophagus, Barrett's   . GERD (gastroesophageal reflux disease)   . History of hiatal hernia   . Hyperlipidemia   . Hypertension   . Hypertensive heart disease   . Stomach ulcer    'had 4; told there were gone w/last endoscopy" (02/13/2016)  . Tobacco abuse     Past Surgical History:  Procedure Laterality Date  . BIOPSY  01/26/2016   Procedure: BIOPSY;  Surgeon: Corbin Ade, MD;  [facility];  Service: Endoscopy;;  gastric  . CARDIAC CATHETERIZATION N/A 02/13/2016   Procedure: Left Heart Cath and Coronary Angiography;  Surgeon: Corky Crafts, MD;  Location: [facility] INVASIVE CV LAB;  Service: Cardiovascular;  Laterality: N/A;  . CARDIAC CATHETERIZATION  02/13/2016   Procedure: Coronary Stent Intervention;  Surgeon: Corky Crafts, MD;  Location: [facility] INVASIVE CV LAB;  Service: Cardiovascular;; mLAD 80% - 3.0 x 24 Synergy DES postdilated to 3.3 mm  . CARDIAC CATHETERIZATION N/A 03/12/2016   Procedure: Left Heart Cath and Coronary Angiography;  Surgeon: Marykay Lex, MD;  Location: [facility] INVASIVE CV LAB;  Service: Cardiovascular;  Laterality: N/A;  . CHOLECYSTECTOMY N/A 03/03/2015   Procedure: LAPAROSCOPIC CHOLECYSTECTOMY;  Surgeon: Franky Macho, MD;  Location: [facility];  Service: General;  Laterality: N/A;  . COLONOSCOPY WITH PROPOFOL N/A 01/26/2016    3 benign sigmoid polyps, internal hemorrhoids, segmental biopsy negative for microscopic colitis. Next colonoscopy in 10 years.   . ESOPHAGOGASTRODUODENOSCOPY  May 2014   [place], Dr. Marva Panda: Grade D erosive esophagitis, Z-line irregular and biopsied, hiatal hernia, gastritis, duodenal biopsy negative for celiac. Biopsy comment from [facility] states "if characteristic  lesion was identified endoscopically within the GE junction, the histologic findings would fulfill criteria for barrett's.   . ESOPHAGOGASTRODUODENOSCOPY (EGD) WITH PROPOFOL N/A 01/26/2016   distal esophagus s/p biopsy, s+barrett's, nodular mucosa s/p biopsy. Empiric dilation. Repeat EGD in 3 years  . HERNIA REPAIR  1969   umbilical  . MALONEY DILATION N/A 01/26/2016   Procedure: Elease Hashimoto DILATION;  Surgeon: Corbin Ade, MD;  [facility];  Service: Endoscopy;  Laterality: N/A;  . POLYPECTOMY  01/26/2016   Procedure: POLYPECTOMY;  Surgeon: Corbin Ade, MD;  [facility];  Service: Endoscopy;;  sigmoid colon  . TOTAL ABDOMINAL HYSTERECTOMY  2014  . TUBAL LIGATION  1990    Social History   Social History  . Marital status: Divorced    Spouse name: N/A  . Number of children: N/A  . Years of education: N/A   Occupational History  . Not on file.   Social History Main Topics  . Smoking status: Former Smoker    Packs/day: 1.00    Years: 32.00    Types: Cigarettes    Quit date: 02/13/2016  . Smokeless tobacco: Never Used     Comment: patient states she had not smoked since June 30th, 2017  . Alcohol use 0.0 oz/week     Comment: 02/13/2016 "a few drinks/year"  . Drug use: No  . Sexual activity: Yes    Birth control/ protection: Surgical   Other Topics Concern  . Not on file   Social History Narrative  . No narrative on file     Vitals:   11/29/16 0950  BP: 132/89  Pulse: 65  SpO2: 98%  Weight: 164 lb (74.4 kg)  Height:  (1.6 m)    Wt Readings from Last 3 Encounters:  11/29/16 164 lb (74.4 kg)  05/26/16 155 lb (70.3 kg)  03/30/16 155 lb (70.3 kg)     PHYSICAL EXAM General: NAD HEENT: Normal. Neck: No JVD, no thyromegaly. Lungs: Clear to auscultation bilaterally with normal respiratory effort. CV: Nondisplaced PMI.  Regular rate and rhythm, normal S1/S2, no S3/S4, no murmur. No pretibial or periankle edema.  No carotid bruit.     Abdomen: Soft, nontender, no distention.  Neurologic: Alert and oriented.  Psych: Normal affect. Skin: Normal. Musculoskeletal: No gross deformities.    ECG: Most recent ECG reviewed.   Labs: Lab Results  Component Value Date/Time   K 3.9 03/08/2016 10:40 AM   K 3.7 02/07/2013 12:48 PM   BUN 18 03/08/2016 10:40 AM   BUN 14 02/07/2013 12:48 PM   CREATININE 0.80 03/08/2016 10:40 AM   CREATININE 0.89 02/07/2013 12:48 PM   ALT 53 03/22/2016 02:55 PM   HGB 13.8 03/08/2016 09:34 AM   HGB 12.3 02/13/2013 07:43 AM     Lipids: Lab Results  Component Value Date/Time   LDLCALC 117 (H) 02/14/2016 02:11 AM   CHOL 176 02/14/2016 02:11 AM   TRIG 132 02/14/2016 02:11 AM  HDL 33 (L) 02/14/2016 02:11 AM       ASSESSMENT AND PLAN: 1. CAD: Symptomatically stable overall. She has a proximal to mid LAD stent. The ostial D1 lesion is 80% stenosed and jailed by the stent. It is a very small caliber vessel. Continue aspirin, Plavix, beta blocker, and statin.  2. Hypertension: Controlled on present therapy. No changes.  3. Hyperlipidemia: Continue statin.  4. Tobacco abuse: She recently began smoking again. She knows she needs to quit.   Disposition: Follow up 6 months  Prentice Docker, M.D., F.A.C.C.

## 2016-11-29 NOTE — Patient Instructions (Signed)

## 2017-01-13 ENCOUNTER — Telehealth: Payer: Self-pay | Admitting: Cardiovascular Disease

## 2017-01-13 NOTE — Telephone Encounter (Signed)
Front desk reports pt has [employer] insurance [facility]. I cannot send her to health dept as she is insured

## 2017-01-13 NOTE — Telephone Encounter (Signed)
[id number] 

## 2017-01-13 NOTE — Telephone Encounter (Signed)
Plavix can be stopped in July. Would recommend she continue metoprolol and Lipitor given her LAD stent.

## 2017-01-13 NOTE — Telephone Encounter (Signed)
Pt happy to hear she can stop plavix in July,will go back on medicine

## 2017-01-13 NOTE — Telephone Encounter (Signed)
Pt has stopped taking:  atorvastatin (LIPITOR) 80 MG tablet [[id number]][[id number]]   metoprolol tartrate (LOPRESSOR) 25 MG tablet [[id number]][[id number]]   clopidogrel (PLAVIX) 75 MG tablet [[id number]][[id number]]   States she cannot afford them and would like to know if there is something else she can take. Also, she's been having dizziness and feels like she has needles sticking her all over her body  Please give her a call @ [phone]

## 2017-03-31 IMAGING — NM NM HEPATO W/GB/PHARM/[PERSON_NAME]
2 series · 12 of 12 positions shown · non-contrast
Comparison: None.

CLINICAL DATA: Generalized abdominal pain. Nausea. Bloating.
Worsening symptoms postprandially.

EXAM:
NUCLEAR MEDICINE HEPATOBILIARY IMAGING WITH GALLBLADDER EF
TECHNIQUE: Sequential images of the abdomen were obtained [DATE] minutes
following intravenous administration of radiopharmaceutical. After
slow intravenous infusion of 1.5 micrograms Cholecystokinin,
gallbladder ejection fraction was determined.
RADIOPHARMACEUTICALS:  5.4 Gechnetium-88m Choletec IV

[Series 1: biliary · 3.25mm/px · 6 of 60 frames shown]
[frame 6/60]
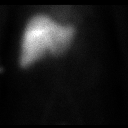
[frame 16/60]
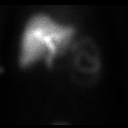
[frame 26/60]
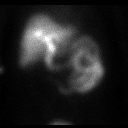
[frame 36/60]
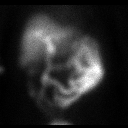
[frame 46/60]
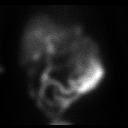
[frame 56/60]
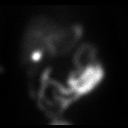

[Series 2: gbef · 3.25mm/px · 6 of 45 frames shown]
[frame 4/45]
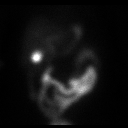
[frame 12/45]
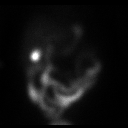
[frame 19/45]
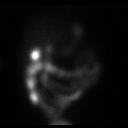
[frame 27/45]
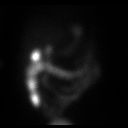
[frame 34/45]
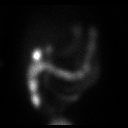
[frame 42/45]
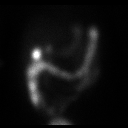

[12 of 12 positions shown; findings below may reference images not displayed]

FINDINGS: Prompt radiopharmaceutical uptake by liver is demonstrated. Liver
has a normal appearance. Prompt biliary excretion of activity is
also noted.

Gallbladder filling with activity is seen, initially on the 45
minute image. Biliary activity reaches small bowel, initially on the
10 minute image.

Following CCK infusion, there is no gallbladder emptying or
response, with effective ejection fraction of 0%. At 45 min, normal
ejection fraction is greater than 40%.

Patient did report abdominal pain and nausea during CCK infusion.
IMPRESSION: No evidence of cystic duct or biliary obstruction.

Absence of gallbladder emptying/response to CCK infusion, with
effective ejection fraction of 0%. Patient also reported symptoms of
abdominal pain and nausea during CCK infusion.

## 2018-04-12 IMAGING — DX DG CHEST 2V
2 series · 2 of 2 positions shown · non-contrast
Comparison: 07/28/2014

CLINICAL DATA: Mid chest pain on off for 2-3 months.

EXAM:
CHEST  2 VIEW

[chest pa]
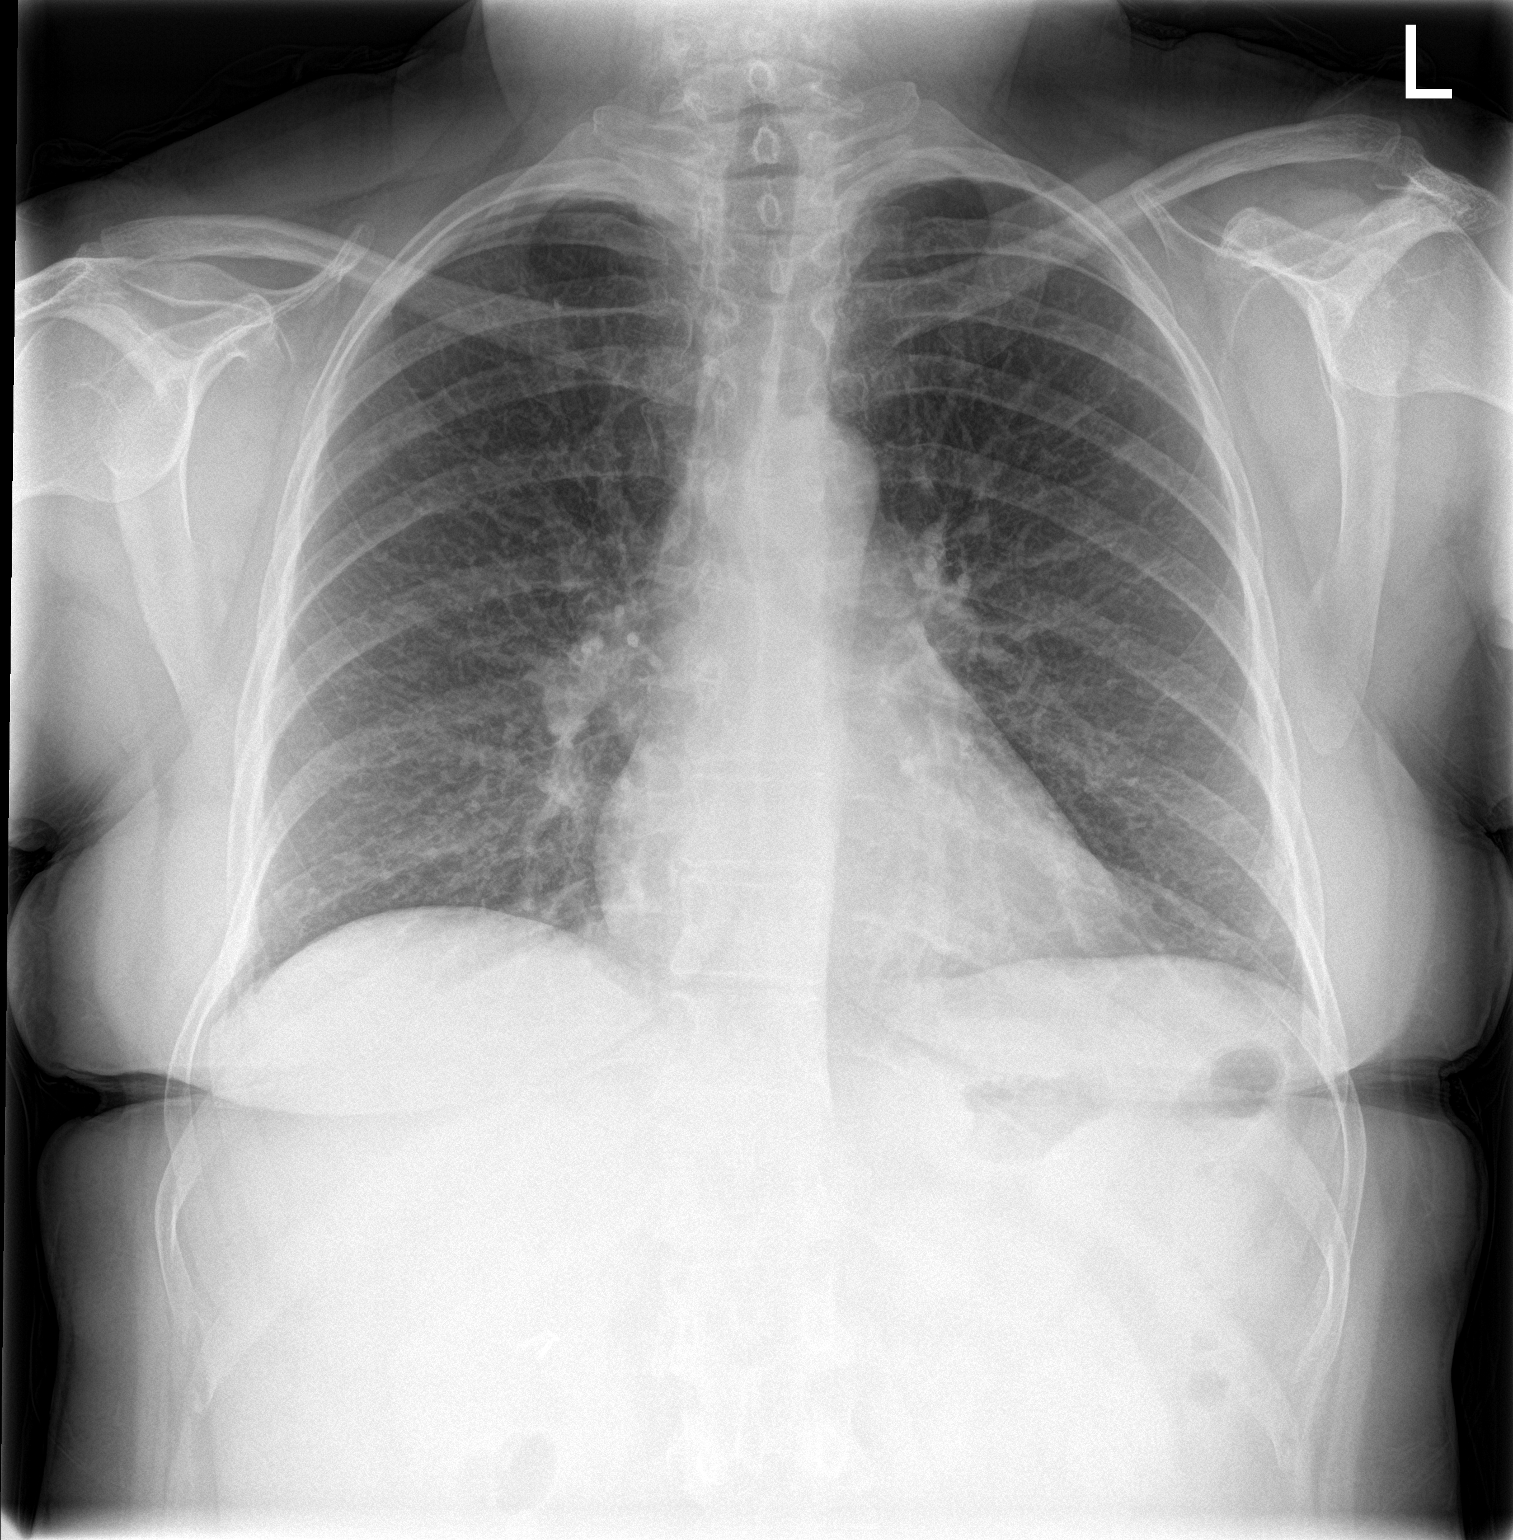

[chest lat]
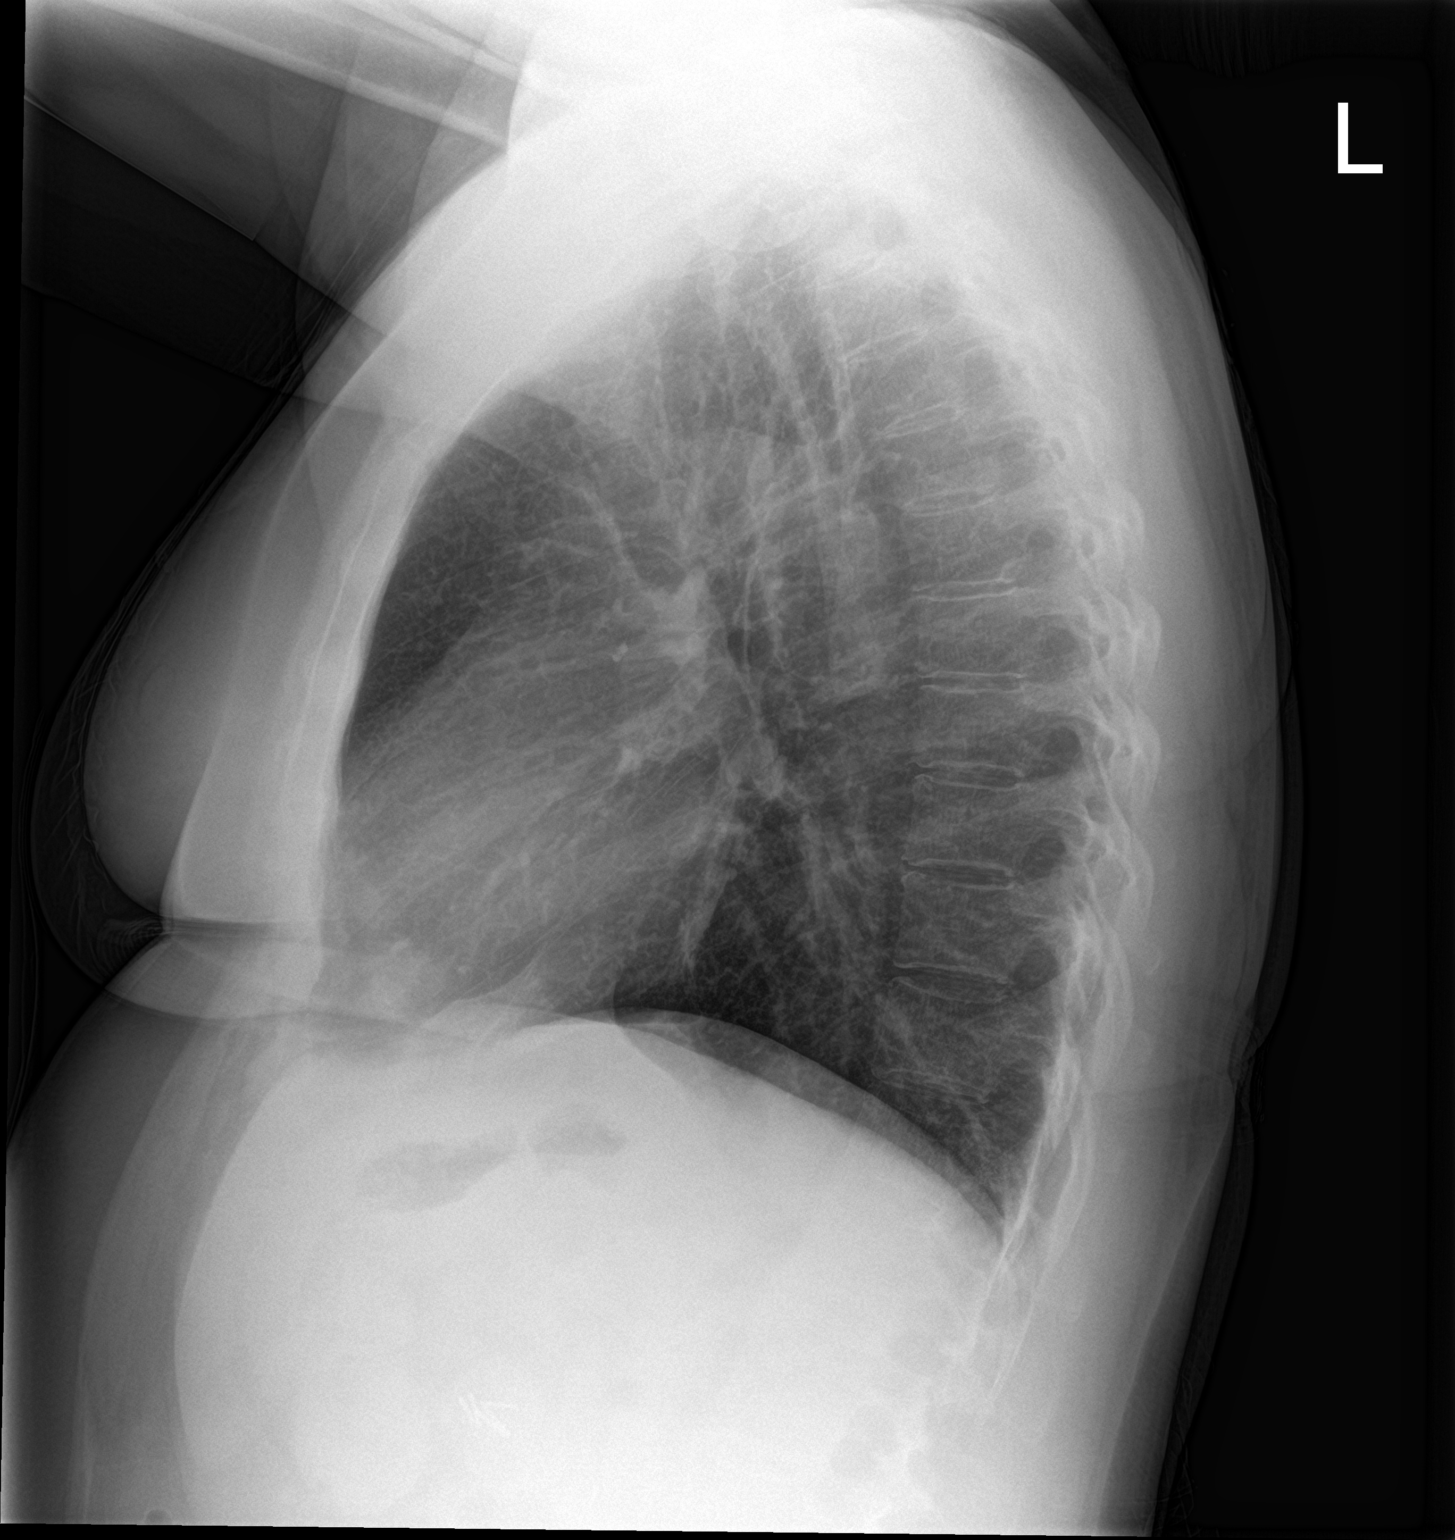

[2 of 2 positions shown; findings below may reference images not displayed]

FINDINGS: The heart size and mediastinal contours are within normal limits.
Both lungs are clear. The visualized skeletal structures are
unremarkable.
IMPRESSION: No active cardiopulmonary disease.

## 2018-06-12 ENCOUNTER — Ambulatory Visit: Payer: Self-pay | Attending: Oncology

## 2019-01-23 ENCOUNTER — Encounter: Payer: Self-pay | Admitting: Internal Medicine

## 2019-11-24 ENCOUNTER — Ambulatory Visit: Payer: Self-pay | Attending: Internal Medicine

## 2019-11-24 DIAGNOSIS — Z23 Encounter for immunization: Secondary | ICD-10-CM

## 2019-11-24 NOTE — Progress Notes (Signed)
   Covid-19 Vaccination Clinic  Name:  Jocelyn Richard    MRN: [id number] DOB: 1965-09-25  11/24/2019  [name] was observed post Covid-19 immunization for 15 minutes without incident. She was provided with Vaccine Information Sheet and instruction to access the V-Safe system.   [name] was instructed to call 911 with any severe reactions post vaccine: Marland Kitchen Difficulty breathing  . Swelling of face and throat  . A fast heartbeat  . A bad rash all over body  . Dizziness and weakness   Immunizations Administered    Name Date Dose VIS Date Route   Moderna COVID-19 Vaccine 11/24/2019  9:58 AM 0.5 mL 07/17/2019 Intramuscular   Manufacturer: Moderna   Lot: [id number]   NDC: 80638-685-48

## 2019-12-22 ENCOUNTER — Ambulatory Visit: Payer: Self-pay | Attending: Internal Medicine

## 2019-12-22 DIAGNOSIS — Z23 Encounter for immunization: Secondary | ICD-10-CM

## 2019-12-22 NOTE — Progress Notes (Signed)
   Covid-19 Vaccination Clinic  Name:  Jocelyn Richard    MRN: [id number] DOB: [birth year]  12/22/2019  [name] was observed post Covid-19 immunization for 15 minutes without incident. She was provided with Vaccine Information Sheet and instruction to access the V-Safe system.   [name] was instructed to call 911 with any severe reactions post vaccine: Marland Kitchen Difficulty breathing  . Swelling of face and throat  . A fast heartbeat  . A bad rash all over body  . Dizziness and weakness   Immunizations Administered    Name Date Dose VIS Date Route   Moderna COVID-19 Vaccine 12/22/2019 10:07 AM 0.5 mL 07/2019 Intramuscular   Manufacturer: Moderna   Lot: [id number]   NDC: 69450-388-82

## 2020-01-24 ENCOUNTER — Other Ambulatory Visit: Payer: Self-pay | Admitting: Family Medicine

## 2020-01-24 DIAGNOSIS — Z1231 Encounter for screening mammogram for malignant neoplasm of breast: Secondary | ICD-10-CM

## 2020-07-31 ENCOUNTER — Ambulatory Visit: Payer: Self-pay

## 2022-03-09 ENCOUNTER — Encounter: Payer: Self-pay | Admitting: Emergency Medicine

## 2022-03-09 ENCOUNTER — Other Ambulatory Visit: Payer: Self-pay

## 2022-03-09 ENCOUNTER — Emergency Department: Payer: Self-pay

## 2022-03-09 DIAGNOSIS — Z91148 Patient's other noncompliance with medication regimen for other reason: Secondary | ICD-10-CM | POA: Insufficient documentation

## 2022-03-09 DIAGNOSIS — I25110 Atherosclerotic heart disease of native coronary artery with unstable angina pectoris: Secondary | ICD-10-CM | POA: Insufficient documentation

## 2022-03-09 LAB — BASIC METABOLIC PANEL
Anion gap: 8 (ref 5–15)
BUN: 18 mg/dL (ref 6–20)
CO2: 26 mmol/L (ref 22–32)
Calcium: 9.3 mg/dL (ref 8.9–10.3)
Chloride: 105 mmol/L (ref 98–111)
Creatinine, Ser: 1.05 mg/dL — ABNORMAL HIGH (ref 0.44–1.00)
GFR, Estimated: >60 mL/min (ref >60)
Glucose, Bld: 125 mg/dL — ABNORMAL HIGH (ref 70–99)
Potassium: 3.5 mmol/L (ref 3.5–5.1)
Sodium: 139 mmol/L (ref 135–145)

## 2022-03-09 LAB — CBC
HCT: 41.3 % (ref 36.0–46.0)
Hemoglobin: 13.8 g/dL (ref 12.0–15.0)
MCH: 30.7 pg (ref 26.0–34.0)
MCHC: 33.4 g/dL (ref 30.0–36.0)
MCV: 92 fL (ref 80.0–100.0)
Platelets: 293 10*3/uL (ref 150–400)
RBC: 4.49 MIL/uL (ref 3.87–5.11)
RDW: 13.4 % (ref 11.5–15.5)
WBC: 13 10*3/uL — ABNORMAL HIGH (ref 4.0–10.5)
nRBC: 0 % (ref 0.0–0.2)

## 2022-03-09 LAB — TROPONIN I (HIGH SENSITIVITY)
Troponin I (High Sensitivity): 4 ng/L (ref <18)
Troponin I (High Sensitivity): 5 ng/L (ref <18)

## 2022-03-09 NOTE — ED Triage Notes (Signed)
Patient to ED via EMS for CP that started around 0200. Patient states pain is centralized and radiates to shoulders. Patient states that she feels like she needs to burp. Patient as stent in heart.

## 2022-03-09 NOTE — ED Triage Notes (Signed)
Per [facility] pt coming from work c/o chest pain x 14 hours. Sharp stabbing pain to right side of chest radiating into right arm. 324 aspirin and 1 nitro given. 20G RAC.

## 2022-03-09 NOTE — ED Provider Triage Note (Signed)
Emergency Medicine Provider Triage Evaluation Note  Jocelyn Richard , a 56 y.o. female  was evaluated in triage.  Pt complains of chest pain, hx of stent in 2018.  Review of Systems  Positive: cp Negative: Abd pain  Physical Exam  BP (!) 160/87 (BP Location: Left Arm)   Pulse 67   Temp 98.3 F (36.8 C) (Oral)   Resp 18   Ht 5\' 2"  (1.575 m)   Wt 77.1 kg   SpO2 98%   BMI 31.09 kg/m  Gen:   Awake, no distress   Resp:  Normal effort  MSK:   Moves extremities without difficulty  Other:    Medical Decision Making  Medically screening exam initiated at 5:31 PM.  Appropriate orders placed.  NAJIA HURLBUTT was informed that the remainder of the evaluation will be completed by another provider, this initial triage assessment does not replace that evaluation, and the importance of remaining in the ED until their evaluation is complete.  Cp protocols initiated   Lorenda Cahill, PA-C 03/09/22 1731

## 2022-03-10 ENCOUNTER — Emergency Department
Admission: EM | Admit: 2022-03-10 | Discharge: 2022-03-10 | Disposition: A | Discharge status: Home / Self Care | Payer: Self-pay | Attending: Emergency Medicine | Admitting: Emergency Medicine

## 2022-03-10 DIAGNOSIS — R079 Chest pain, unspecified: Secondary | ICD-10-CM

## 2022-03-10 DIAGNOSIS — I2510 Atherosclerotic heart disease of native coronary artery without angina pectoris: Secondary | ICD-10-CM

## 2022-03-10 DIAGNOSIS — I25110 Atherosclerotic heart disease of native coronary artery with unstable angina pectoris: Secondary | ICD-10-CM

## 2022-03-10 NOTE — ED Provider Notes (Signed)
[facility] Provider Note    Event Date/Time   First MD Initiated Contact with Patient 03/10/22 0141     (approximate)   History   Chest Pain   HPI  Jocelyn Richard is a 56 y.o. female with known coronary artery disease including cardiac stent in 2017.  She has no cardiologist at this time.  She presents for evaluation of about 24 hours of intermittent but relatively persistent sharp central chest pain.  No shortness of breath.  No nausea nor vomiting.  No recent immobilizations, trips, or surgeries.  Nothing in particular seems to make it better or worse.  She also has a history of acid reflux and says that it feels somewhat similar to issues she has had in the past with her reflux, but she is understandably concerned given her cardiac history as well.  The pain seems to radiate sometimes to her right arm.  She says she has been compliant with her regular medication including her aspirin.     Physical Exam   Triage Vital Signs: ED Triage Vitals  Enc Vitals Group     BP 03/09/22 1729 (!) 160/87     Pulse Rate 03/09/22 1729 67     Resp 03/09/22 1729 18     Temp 03/09/22 1729 98.3 F (36.8 C)     Temp Source 03/09/22 1729 Oral     SpO2 03/09/22 1629 94 %     Weight 03/09/22 1730 77.1 kg (170 lb)     Height 03/09/22 1730 1.575 m (5\' 2" )     Head Circumference --      Peak Flow --      Pain Score 03/09/22 1730 7     Pain Loc --      Pain Edu? --      Excl. in GC? --     Most recent vital signs: Vitals:   03/09/22 2221 03/10/22 0100  BP: (!) 156/71 116/76  Pulse: 61 62  Resp: 16 16  Temp: 98.3 F (36.8 C)   SpO2: 98% 96%     General: Awake, no distress.  CV:  Good peripheral perfusion.  Normal heart sounds. Resp:  Normal effort.  Lungs are clear to auscultation. Abd:  No distention.  No tenderness to palpation of her abdomen. Other:  Normal mentation, communicative, alert and oriented, has decision-making capacity.   ED Results /  Procedures / Treatments   Labs (all labs ordered are listed, but only abnormal results are displayed) Labs Reviewed  BASIC METABOLIC PANEL - Abnormal; Notable for the following components:      Result Value   Glucose, Bld 125 (*)    Creatinine, Ser 1.05 (*)    All other components within normal limits  CBC - Abnormal; Notable for the following components:   WBC 13.0 (*)    All other components within normal limits  POC URINE PREG, ED  TROPONIN I (HIGH SENSITIVITY)  TROPONIN I (HIGH SENSITIVITY)     EKG  ED ECG REPORT I, 03/12/22, the attending physician, personally viewed and interpreted this ECG.  Date: 03/09/2022 EKG Time: 22:15 Rate: 57 Rhythm: sinus bradycardia QRS Axis: normal Intervals: normal ST/T Wave abnormalities: normal Narrative Interpretation: no evidence of acute ischemia    RADIOLOGY I viewed and interpreted the patient's two-view chest x-ray and I see no evidence of pneumonia, widened mediastinum, pericardial effusion, nor pneumothorax.  I also read the radiologist's report, which confirmed no acute findings.    PROCEDURES:  Critical Care performed: No  Procedures   MEDICATIONS ORDERED IN ED: Medications - No data to display   IMPRESSION / MDM / ASSESSMENT AND PLAN / ED COURSE  I reviewed the triage vital signs and the nursing notes.                              Differential diagnosis includes, but is not limited to, angina, ACS including unstable angina, ACS, pneumonia, pneumothorax, musculoskeletal strain.  Patient's presentation is most consistent with acute presentation with potential threat to life or bodily function.  Labs/studies ordered include EKG, two-view chest x-ray, high-sensitivity troponin x2, basic metabolic panel, CBC.  Labs are notable for very slight leukocytosis of 13 which is likely not clinically significant.  Basic metabolic panel and high-sensitivity troponin x2 are all within normal limits.  Vital signs have  been stable and within normal limits.  As documented above, her chest x-ray is normal and there is no evidence of ischemia on her EKG.  I reviewed her medical record including her cardiac catheterizations x2 in 2017.  She has existing known coronary artery disease and had 1 stent placed.  She is currently not going to a cardiologist and she admits to continued tobacco use.  She is certainly at risk for angina or even unstable angina but fortunately at this time she is not having an MI.  I talked with her and her husband about admission to the hospital for additional cardiac work-up including heparin and cardiology consultation.  I explained I cannot guarantee exactly what would be done (for example, whether or not she would get a catheterization), but I explained that the benefit is that if she got worse she would already be here.  Alternatively, I explained that I could order a referral to a local cardiologist, and she will likely be able to be seen within the next couple of days or a week.  She strongly prefers to go home.  We discussed the risks and benefits a couple of times and she is comfortable with the plan.  I think that is reasonable given that she has been having pain relatively persistently for 24 hours but her troponins remain normal and there is no evidence of ischemia on her EKG.  However I gave her and her husband strict return precautions should she develop any worsening symptoms, and she understands and agrees with the plan.  I counseled her about tobacco cessation as well.       FINAL CLINICAL IMPRESSION(S) / ED DIAGNOSES   Final diagnoses:  Chest pain, unspecified type  Coronary artery disease involving native coronary artery of native heart with unstable angina pectoris (HCC)  CAD in native artery     Rx / DC Orders   ED Discharge Orders          Ordered    Ambulatory referral to Cardiology       Comments: If you have not heard from the Cardiology office within the  next 72 hours please call [phone].   03/10/22 0236             Note:  This document was prepared using Dragon voice recognition software and may include unintentional dictation errors.   Loleta Rose, MD 03/10/22 [phone]

## 2022-03-10 NOTE — Discharge Instructions (Addendum)
You have been seen in the Emergency Department (ED) today for chest pain.  As we have discussed today's test results are normal, but you may require further testing.  We discussed bringing you into the hospital for additional evaluation, but your preference is to follow-up as an outpatient.  We ordered a referral to the cardiology team, so someone should contact you by phone within the next couple of days to schedule a close follow-up appointment.  Please follow up with the recommended doctor as instructed above in these documents regarding today's emergent visit and your recent symptoms to discuss further management.  Continue to take your regular medications.   Return to the Emergency Department (ED) if you experience any further chest pain/pressure/tightness, difficulty breathing, or sudden sweating, or other symptoms that concern you.

## 2022-06-03 ENCOUNTER — Other Ambulatory Visit: Payer: Self-pay

## 2022-06-03 ENCOUNTER — Encounter (HOSPITAL_COMMUNITY): Payer: Self-pay | Admitting: Emergency Medicine

## 2022-06-03 ENCOUNTER — Emergency Department (HOSPITAL_COMMUNITY): Payer: Self-pay

## 2022-06-03 ENCOUNTER — Observation Stay (HOSPITAL_COMMUNITY)
Admission: EM | Admit: 2022-06-03 | Discharge: 2022-06-05 | Disposition: A | Discharge status: Home / Self Care | Payer: Self-pay | Attending: Student in an Organized Health Care Education/Training Program | Admitting: Student in an Organized Health Care Education/Training Program

## 2022-06-03 DIAGNOSIS — R079 Chest pain, unspecified: Secondary | ICD-10-CM | POA: Diagnosis present

## 2022-06-03 DIAGNOSIS — K219 Gastro-esophageal reflux disease without esophagitis: Secondary | ICD-10-CM | POA: Diagnosis present

## 2022-06-03 DIAGNOSIS — Z7982 Long term (current) use of aspirin: Secondary | ICD-10-CM | POA: Insufficient documentation

## 2022-06-03 DIAGNOSIS — Z79899 Other long term (current) drug therapy: Secondary | ICD-10-CM | POA: Insufficient documentation

## 2022-06-03 DIAGNOSIS — E785 Hyperlipidemia, unspecified: Secondary | ICD-10-CM | POA: Diagnosis present

## 2022-06-03 DIAGNOSIS — Z720 Tobacco use: Secondary | ICD-10-CM | POA: Diagnosis present

## 2022-06-03 DIAGNOSIS — F17210 Nicotine dependence, cigarettes, uncomplicated: Secondary | ICD-10-CM | POA: Insufficient documentation

## 2022-06-03 DIAGNOSIS — R0600 Dyspnea, unspecified: Secondary | ICD-10-CM | POA: Insufficient documentation

## 2022-06-03 DIAGNOSIS — Z955 Presence of coronary angioplasty implant and graft: Secondary | ICD-10-CM | POA: Insufficient documentation

## 2022-06-03 DIAGNOSIS — R0789 Other chest pain: Principal | ICD-10-CM | POA: Insufficient documentation

## 2022-06-03 DIAGNOSIS — I10 Essential (primary) hypertension: Secondary | ICD-10-CM | POA: Diagnosis present

## 2022-06-03 DIAGNOSIS — I2510 Atherosclerotic heart disease of native coronary artery without angina pectoris: Secondary | ICD-10-CM

## 2022-06-03 DIAGNOSIS — I119 Hypertensive heart disease without heart failure: Secondary | ICD-10-CM | POA: Insufficient documentation

## 2022-06-03 DIAGNOSIS — Z7902 Long term (current) use of antithrombotics/antiplatelets: Secondary | ICD-10-CM | POA: Insufficient documentation

## 2022-06-03 LAB — CBC
HCT: 44 % (ref 36.0–46.0)
Hemoglobin: 15 g/dL (ref 12.0–15.0)
MCH: 31 pg (ref 26.0–34.0)
MCHC: 34.1 g/dL (ref 30.0–36.0)
MCV: 90.9 fL (ref 80.0–100.0)
Platelets: 321 10*3/uL (ref 150–400)
RBC: 4.84 MIL/uL (ref 3.87–5.11)
RDW: 13.4 % (ref 11.5–15.5)
WBC: 15.7 10*3/uL — ABNORMAL HIGH (ref 4.0–10.5)
nRBC: 0 % (ref 0.0–0.2)

## 2022-06-03 LAB — BASIC METABOLIC PANEL
Anion gap: 14 (ref 5–15)
BUN: 12 mg/dL (ref 6–20)
CO2: 25 mmol/L (ref 22–32)
Calcium: 9.4 mg/dL (ref 8.9–10.3)
Chloride: 100 mmol/L (ref 98–111)
Creatinine, Ser: 0.86 mg/dL (ref 0.44–1.00)
GFR, Estimated: >60 mL/min (ref >60)
Glucose, Bld: 135 mg/dL — ABNORMAL HIGH (ref 70–99)
Potassium: 3.2 mmol/L — ABNORMAL LOW (ref 3.5–5.1)
Sodium: 139 mmol/L (ref 135–145)

## 2022-06-03 LAB — TROPONIN I (HIGH SENSITIVITY): Troponin I (High Sensitivity): 7 ng/L (ref <18)

## 2022-06-03 MED ORDER — ONDANSETRON 4 MG PO TBDP
4.0000 mg | ORAL_TABLET | Freq: Once | ORAL | Status: AC
  Administered 2022-06-03: 4 mg via ORAL
  Filled 2022-06-03: qty 1

## 2022-06-03 MED ORDER — ASPIRIN 81 MG PO CHEW
324.0000 mg | CHEWABLE_TABLET | Freq: Once | ORAL | Status: AC
  Administered 2022-06-03: 324 mg via ORAL
  Filled 2022-06-03: qty 4

## 2022-06-03 NOTE — ED Triage Notes (Signed)
Patient reports central chest pain this evening with SOB , emesis and diaphoresis . History of CAD/Coronary Stent.

## 2022-06-03 NOTE — ED Provider Triage Note (Signed)
Emergency Medicine Provider Triage Evaluation Note  Jocelyn Richard , a 56 y.o. female  was evaluated in triage.  Pt complains of chest pain, shortness of breath, nausea, vomiting.  Patient states she started having chest pain at approximately 630.  She describes it as pressure in the substernal region.  She rates severity as 9 out of 10 at this time.  Currently she notes the pain is subsided to 7 out of 10 in severity but she continues to feel short of breath and nauseated.  She took no medicine prior to arrival.  She was evaluated by EMS in her home county but declined transport by EMS and came by personal vehicle.  She has a history of cardiac stents in approximately 2016.  She denies abdominal pain Review of Systems  Positive: As above Negative: As above  Physical Exam  There were no vitals taken for this visit. Gen:   Awake, no distress   Resp:  Normal effort  MSK:   Moves extremities without difficulty  Other:    Medical Decision Making  Medically screening exam initiated at 9:02 PM.  Appropriate orders placed.  Jocelyn Richard was informed that the remainder of the evaluation will be completed by another provider, this initial triage assessment does not replace that evaluation, and the importance of remaining in the ED until their evaluation is complete.     Dorothyann Peng, PA-C 06/03/22 2102

## 2022-06-04 DIAGNOSIS — R079 Chest pain, unspecified: Secondary | ICD-10-CM | POA: Diagnosis present

## 2022-06-04 DIAGNOSIS — I200 Unstable angina: Secondary | ICD-10-CM

## 2022-06-04 LAB — HIV ANTIBODY (ROUTINE TESTING W REFLEX): HIV Screen 4th Generation wRfx: NONREACTIVE

## 2022-06-04 LAB — LIPID PANEL
Cholesterol: 137 mg/dL (ref 0–200)
HDL: 37 mg/dL — ABNORMAL LOW (ref >40)
LDL Cholesterol: 75 mg/dL (ref 0–99)
Total CHOL/HDL Ratio: 3.7 RATIO
Triglycerides: 126 mg/dL (ref <150)
VLDL: 25 mg/dL (ref 0–40)

## 2022-06-04 LAB — HEMOGLOBIN A1C
Hgb A1c MFr Bld: 5.9 % — ABNORMAL HIGH (ref 4.8–5.6)
Mean Plasma Glucose: 122.63 mg/dL

## 2022-06-04 LAB — BRAIN NATRIURETIC PEPTIDE: B Natriuretic Peptide: 11.3 pg/mL (ref 0.0–100.0)

## 2022-06-04 LAB — TSH: TSH: 1.287 u[IU]/mL (ref 0.350–4.500)

## 2022-06-04 LAB — RAPID URINE DRUG SCREEN, HOSP PERFORMED
Amphetamines: NOT DETECTED
Barbiturates: NOT DETECTED
Benzodiazepines: NOT DETECTED
Cocaine: NOT DETECTED
Opiates: NOT DETECTED
Tetrahydrocannabinol: POSITIVE — AB

## 2022-06-04 LAB — PROTIME-INR
INR: 1.1 (ref 0.8–1.2)
Prothrombin Time: 13.6 seconds (ref 11.4–15.2)

## 2022-06-04 LAB — TROPONIN I (HIGH SENSITIVITY): Troponin I (High Sensitivity): 5 ng/L (ref <18)

## 2022-06-04 LAB — MAGNESIUM: Magnesium: 1.8 mg/dL (ref 1.7–2.4)

## 2022-06-04 LAB — APTT: aPTT: 33 seconds (ref 24–36)

## 2022-06-04 MED ORDER — ENOXAPARIN SODIUM 40 MG/0.4ML IJ SOSY
40.0000 mg | PREFILLED_SYRINGE | INTRAMUSCULAR | Status: DC
  Administered 2022-06-05: 40 mg via SUBCUTANEOUS
  Filled 2022-06-04: qty 0.4

## 2022-06-04 MED ORDER — VARENICLINE TARTRATE 1 MG PO TABS: 1.0000 mg | ORAL_TABLET | Freq: Two times a day (BID) | ORAL | Status: DC

## 2022-06-04 MED ORDER — POTASSIUM CHLORIDE CRYS ER 20 MEQ PO TBCR
40.0000 meq | EXTENDED_RELEASE_TABLET | Freq: Once | ORAL | Status: AC
  Administered 2022-06-04: 40 meq via ORAL
  Filled 2022-06-04: qty 2

## 2022-06-04 MED ORDER — ATORVASTATIN CALCIUM 80 MG PO TABS
80.0000 mg | ORAL_TABLET | Freq: Every day | ORAL | Status: DC
Start: 2022-06-04 — End: 2022-06-05
  Administered 2022-06-04 – 2022-06-05 (×2): 80 mg via ORAL
  Filled 2022-06-04: qty 1
  Filled 2022-06-04: qty 2

## 2022-06-04 MED ORDER — ACETAMINOPHEN 325 MG PO TABS
650.0000 mg | ORAL_TABLET | Freq: Four times a day (QID) | ORAL | Status: DC | PRN
  Administered 2022-06-04: 650 mg via ORAL
  Filled 2022-06-04: qty 2

## 2022-06-04 MED ORDER — VARENICLINE TARTRATE 0.5 MG PO TABS
0.5000 mg | ORAL_TABLET | Freq: Every day | ORAL | Status: DC
  Administered 2022-06-04 – 2022-06-05 (×2): 0.5 mg via ORAL
  Filled 2022-06-04 (×2): qty 1

## 2022-06-04 MED ORDER — PANTOPRAZOLE SODIUM 40 MG PO TBEC
40.0000 mg | DELAYED_RELEASE_TABLET | Freq: Every day | ORAL | Status: DC
  Administered 2022-06-04 – 2022-06-05 (×2): 40 mg via ORAL
  Filled 2022-06-04 (×2): qty 1

## 2022-06-04 MED ORDER — NICOTINE 14 MG/24HR TD PT24
14.0000 mg | MEDICATED_PATCH | Freq: Every day | TRANSDERMAL | Status: DC
  Administered 2022-06-04: 14 mg via TRANSDERMAL
  Filled 2022-06-04: qty 1

## 2022-06-04 MED ORDER — ACETAMINOPHEN 650 MG RE SUPP: 650.0000 mg | Freq: Four times a day (QID) | RECTAL | Status: DC | PRN

## 2022-06-04 MED ORDER — VARENICLINE TARTRATE 0.5 MG PO TABS: 0.5000 mg | ORAL_TABLET | Freq: Two times a day (BID) | ORAL | Status: DC

## 2022-06-04 MED ORDER — ASPIRIN 81 MG PO TBEC
81.0000 mg | DELAYED_RELEASE_TABLET | Freq: Every day | ORAL | Status: DC
  Administered 2022-06-04 – 2022-06-05 (×2): 81 mg via ORAL
  Filled 2022-06-04 (×2): qty 1

## 2022-06-04 MED ORDER — NICOTINE 14 MG/24HR TD PT24: 14.0000 mg | MEDICATED_PATCH | Freq: Every day | TRANSDERMAL | Status: DC

## 2022-06-04 NOTE — H&P (Cosign Needed Addendum)
Date: 06/05/2022              Patient Name:  Jocelyn Richard MRN: [id number]  DOB: 1966-06-07 Age / Sex: 56 y.o., female   PCP: [facility]         Medical Service: Internal Medicine Teaching Service         Attending Physician: Dr. Oswaldo Done, Marquita Palms, *    First Contact: Dr. Benito Mccreedy Pager: [phone]  Second Contact: Dr. Cyndie Chime Pager: [phone]       After Hours (After 5p/  First Contact Pager: [phone]  weekends / holidays): Second Contact Pager: [phone]   Chief Complaint: Chest pain  History of Present Illness: 56 year old female with a history of coronary artery disease and stenting to LAD, tobacco use, GERD, presents with chest pain associated with nausea, vomiting, and diaphoresis.  Onset of pain was abrupt while she was sitting and talking with her niece.  She describes the pain as a pressure and points to region of xiphoid and under left breast.  Also has some back pain, unsure if this is radiating from the chest or from something else.  The pain started around 6 PM yesterday.  With the pain she had nausea, nonbloody emesis, and dizziness like she was going to pass out.  It was severe enough that she went to the emergency department by EMS.  By the time she arrived around 8 PM, her pain resolved and at the time of my interview she is free of chest pain.    Recent history is notable for months of similar but less severe episodes.  During her last ED visit in July, she used nitroglycerin with resolution of her pain.  She has been seen by her primary care physician for these episodes.  At that time, changes to her EKG were noted and the patient was referred to cardiology.  Her symptoms are not worse with exertion.  She also reports months of fatigue, dizzy spells, swelling in her legs.  She has a history of GERD and reports that if she does not take her daily PPI she feels very sick.  Review of systems negative for fevers, chills, weight loss, rash, new or  worsening cough, loss of appetite, dyspnea, PND, orthopnea.  Prior to arrival she was treated by EMS with 325 mg aspirin.  Meds:  Current Meds  Medication Sig   acetaminophen (TYLENOL) 500 MG tablet Take 1,000 mg by mouth every 6 (six) hours as needed for mild pain.   aspirin EC 81 MG tablet Take 1 tablet (81 mg total) by mouth daily.   atorvastatin (LIPITOR) 80 MG tablet TAKE 1 TABLET(80 MG) BY MOUTH DAILY AT 6 PM (Patient taking differently: Take 80 mg by mouth daily.)   esomeprazole (NEXIUM) 40 MG capsule Take 40 mg by mouth daily.   hydrOXYzine (ATARAX) 25 MG tablet Take 25 mg by mouth 4 (four) times daily.     Allergies: Allergies as of 06/03/2022   (No Known Allergies)   Past Medical History:  Diagnosis Date   Anxiety    Arthritis    "left neck" (02/13/2016)   CAD (coronary artery disease)    a. 01/2016 Cath/PCI: LM nl, LAD 51m (3.0x24 Synergy DES), LCX 25ost, RCA min irregs, EF 55-65%. b. Relook cath 02/2016 with stable angiography; small caliber jailed D1 could be culprit but MD suspected GI vs anxiety as cause for recurrent sx.   Esophagus, Barrett's    GERD (gastroesophageal reflux disease)  History of hiatal hernia    Hyperlipidemia    Hypertension    Hypertensive heart disease    Stomach ulcer    'had 4; told there were gone w/last endoscopy" (02/13/2016)   Tobacco abuse     Family History: History of heart disease in mother and father in their 52s.  Social History: Lives at home with her 3 teenage grandchildren.  They keep her busy.  She just took her third grandchild into her house recently.  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  She has smoked half pack cigarettes per day for 30 years.  She has approximately 4 drinks of alcohol per week.  She used to use marijuana.  She denies use of other drugs.  Physical Exam: Blood pressure 117/72, pulse 60, temperature 97.8 F (36.6 C), temperature source Oral, resp. rate 18, height 5\' 2"  (1.575 m),  weight 76.2 kg, SpO2 98 %. General: Laying in bed.  No apparent distress or discomfort. HEENT: Moist mucous membranes.  No cervical lymphadenopathy. Cardiovascular: Regular rate and rhythm.  Murmurs.  Right radial and bilateral DP and PT pulses 2+.  Normal JVP. Respiratory: Normal work of breathing.  No wheezing or crackles. Abdominal: Soft, nondistended, nontender. Extremities: Trace pitting edema bilateral lower extremities. Skin: Warm and dry. Neuro: Alert.  No gross focal deficits.  No gross cranial nerve deficits. Psych: Affect is sad and mood concordant.  EKG: Sinus rhythm.  1 to 2 mm of ST segment depression in V4 and V5, with T wave inversion in same, new since prior EKG.  CXR: No cardiomegaly.  No pulmonary edema.  No pulmonary vascular congestion.  Labs: Notable for mild leukocytosis, mild hypokalemia, normal BNP, normal TSH, normal troponins  Assessment & Plan  ROBIN PETRAKIS is a 56 y.o. female with coronary artery disease, GERD, tobacco use who presents with sudden onset chest pressure associated with nausea, vomiting, diaphoresis, found to have new EKG changes without evidence of myocardial infarction.  Principal Problem:   Chest pain Active Problems:   GERD (gastroesophageal reflux disease)   Hyperlipidemia   CAD S/P percutaneous coronary angioplasty: s/p DES PCI to mLAD  Concern for unstable angina CAD s/p DES to LAD 2017 Last catheterization in 2017 showed no re-stenosis of stented LAD, 80% stenosis to ostial first diagonal which was noted to be a very small caliber vessel.  Her new EKG changes support a cardiac etiology for her symptoms.  I would expect unstable angina to be worse with exertion, but overall her symptoms are not that atypical for cardiac chest pain.  She is without chest pain at time of my interview and has no elevated troponin, thus I do not suspect infarction or critical stenosis of a coronary artery requiring anticoagulation or prompt PCI at this  point.  She has vague systemic symptoms that could be attributable to new heart failure but no orthopnea or PND and is not hypervolemic on exam.  We will get an echo to evaluate this further, the presence of new heart failure would further support unstable ischemic disease.  Heart score of 5 confers 12-16.6% risk of MACE in 6 weeks.  Another possibility is severe GERD related pain that led to vasovagal reflex type symptoms, but this would be diagnosis of exclusion in her case.  Low index of suspicion for a pulmonary cause of her pain given lack of dyspnea, new or worsening cough, and normal chest x-ray.   - Echo to evaluate for wall motion abnormalities or  newly reduced EF - Consider stress testing with cardiology on discharge - Monitor on telemetry - Continue aspirin 81 mg daily - Continue atorvastatin 80 mg daily - Monitor I's/O and daily weight  Concern for mood disorder Patient reports increased sadness ever since her sister died of lung cancer a year ago.  She reports difficulty falling asleep, she finds herself eating more, difficulty concentrating, the sensation like she is moving in slow motion. She denies feelings of guilt or worthlessness.  She denies anhedonia.  She has a large burden of psychosocial stress at home with her 3 teenage grandchildren currently living with her. - Recommend PCP follow-up for evaluation treatment of major depressive disorder  Tobacco use At least 15 pack years. Expressed a desire to quit smoking. She would like to try Chantix. - Start Chantix 0.5 mg daily - Nicotine replacement therapy  GERD - Pantoprazole 40 mg daily  Diet: Heart Healthy IVF: None VTE: Enoxaparin Code: Full Surrogate: Significant other, Mauricio Po, [phone]  Dispo: Admit patient to Observation with expected length of stay less than 2 midnights.  Signed: Marrianne Mood MD 06/05/2022, 6:10 AM  Pager: [phone] After 5pm on weekdays and 1pm on weekends: On Call pager:  [phone]

## 2022-06-04 NOTE — ED Notes (Signed)
Admitting providers at bedside

## 2022-06-04 NOTE — ED Provider Notes (Signed)
[facility] EMERGENCY DEPARTMENT Provider Note   CSN: [id number] Arrival date & time: 06/03/22  2017     History  Chief Complaint  Patient presents with   Chest Pain    CAD/Coronary Stent    Jocelyn Richard is a 56 y.o. female.  56 year old female presents with increasing episodes of chest discomfort over the last several days.  States that she does have history of coronary artery stent 6 years ago and was supposed to be on Plavix but has not followed up with cardiology.  Notes that her pain lasted for approximate hour and resolved spontaneously.  Had some associated diaphoresis and dyspnea with this.  No leg pain or swelling.  No fever or chills.  Patient currently pain-free.  Saw her doctor this week for similar symptoms and was given outpatient referral to cardiology which has not been performed.  Was noted to have EKG changes in the office when she was seen.       Home Medications Prior to Admission medications   Medication Sig Start Date End Date Taking? Authorizing Provider  aspirin EC 81 MG tablet Take 1 tablet (81 mg total) by mouth daily. 02/03/16   Erick Blinks, MD  atorvastatin (LIPITOR) 80 MG tablet TAKE 1 TABLET(80 MG) BY MOUTH DAILY AT 6 PM 08/30/16   Laqueta Linden, MD  clopidogrel (PLAVIX) 75 MG tablet Take 1 tablet (75 mg total) by mouth daily. 11/08/16   Laqueta Linden, MD  escitalopram (LEXAPRO) 10 MG tablet Take one tablet by mouth once daily 01/02/16   [provider]  linaclotide [facility]) 145 MCG CAPS capsule Take 1 capsule (145 mcg total) by mouth daily before breakfast. 04/16/16   Gelene Mink, NP  metoprolol tartrate (LOPRESSOR) 25 MG tablet TAKE 1/2 TABLET(12.5 MG) BY MOUTH TWICE DAILY 08/30/16   Laqueta Linden, MD  naphazoline-glycerin (CLEAR EYES) 0.012-0.2 % SOLN Place 1 drop into both eyes daily.    [provider]  nicotine (NICODERM CQ - DOSED IN MG/24 HOURS) 14 mg/24hr patch Place 1 patch (14 mg  total) onto the skin daily. Patient not taking: Reported on 11/29/2016 02/15/16   Creig Hines, NP  nitroGLYCERIN (NITROSTAT) 0.4 MG SL tablet Place 1 tablet (0.4 mg total) under the tongue every 5 (five) minutes as needed for chest pain. 02/03/16   Erick Blinks, MD  pantoprazole (PROTONIX) 40 MG tablet Take 1 tablet (40 mg total) by mouth daily. 03/30/16   Gelene Mink, NP  famotidine (PEPCID) 20 MG tablet Take 1 tablet (20 mg total) by mouth 2 (two) times daily. Patient not taking: Reported on 12/13/2014 07/28/14 12/13/14  Eber Hong, MD  sucralfate (CARAFATE) 1 G tablet Take 1 tablet (1 g total) by mouth 4 (four) times daily -  with meals and at bedtime. Patient not taking: Reported on 12/13/2014 07/28/14 12/13/14  Eber Hong, MD      Allergies    Patient has no known allergies.    Review of Systems   Review of Systems  All other systems reviewed and are negative.   Physical Exam Updated Vital Signs BP 113/73   Pulse 60   Temp 98.2 F (36.8 C) (Oral)   Resp 17   SpO2 96%  Physical Exam Vitals and nursing note reviewed.  Constitutional:      General: She is not in acute distress.    Appearance: Normal appearance. She is well-developed. She is not toxic-appearing.  HENT:     Head:  Normocephalic and atraumatic.  Eyes:     General: Lids are normal.     Conjunctiva/sclera: Conjunctivae normal.     Pupils: Pupils are equal, round, and reactive to light.  Neck:     Thyroid: No thyroid mass.     Trachea: No tracheal deviation.  Cardiovascular:     Rate and Rhythm: Normal rate and regular rhythm.     Heart sounds: Normal heart sounds. No murmur heard.    No gallop.  Pulmonary:     Effort: Pulmonary effort is normal. No respiratory distress.     Breath sounds: Normal breath sounds. No stridor. No decreased breath sounds, wheezing, rhonchi or rales.  Abdominal:     General: There is no distension.     Palpations: Abdomen is soft.     Tenderness: There is no  abdominal tenderness. There is no rebound.  Musculoskeletal:        General: No tenderness. Normal range of motion.     Cervical back: Normal range of motion and neck supple.  Skin:    General: Skin is warm and dry.     Findings: No abrasion or rash.  Neurological:     Mental Status: She is alert and oriented to person, place, and time. Mental status is at baseline.     GCS: GCS eye subscore is 4. GCS verbal subscore is 5. GCS motor subscore is 6.     Cranial Nerves: No cranial nerve deficit.     Sensory: No sensory deficit.     Motor: Motor function is intact.  Psychiatric:        Attention and Perception: Attention normal.        Speech: Speech normal.        Behavior: Behavior normal.     ED Results / Procedures / Treatments   Labs (all labs ordered are listed, but only abnormal results are displayed) Labs Reviewed  BASIC METABOLIC PANEL - Abnormal; Notable for the following components:      Result Value   Potassium 3.2 (*)    Glucose, Bld 135 (*)    All other components within normal limits  CBC - Abnormal; Notable for the following components:   WBC 15.7 (*)    All other components within normal limits  TROPONIN I (HIGH SENSITIVITY)  TROPONIN I (HIGH SENSITIVITY)    EKG EKG Interpretation  Date/Time:  Thursday June 03 2022 20:53:55 EDT Ventricular Rate:  77 PR Interval:  132 QRS Duration: 82 QT Interval:  410 QTC Calculation: 463 R Axis:   54 Text Interpretation: Normal sinus rhythm ST & T wave abnormality, consider inferolateral ischemia Prolonged QT Abnormal ECG When compared with ECG of 09-Mar-2022 22:15,  changes are present Confirmed by Margarita Grizzle [phone]) on 06/04/2022 11:42:04 AM  Radiology DG Chest 2 View  Result Date: 06/03/2022 CLINICAL DATA:  Chest pain EXAM: CHEST - 2 VIEW COMPARISON:  Chest x-ray 03/09/2022 FINDINGS: The heart and mediastinal contours are unchanged. Coronary calcification. Bibasilar atelectasis. No focal consolidation. No  pulmonary edema. No pleural effusion. No pneumothorax. No acute osseous abnormality. IMPRESSION: No active cardiopulmonary disease. Electronically Signed   By: Tish Frederickson M.D.   On: 06/03/2022 22:16    Procedures Procedures    Medications Ordered in ED Medications  aspirin chewable tablet 324 mg (324 mg Oral Given 06/03/22 2103)  ondansetron (ZOFRAN-ODT) disintegrating tablet 4 mg (4 mg Oral Given 06/03/22 2103)    ED Course/ Medical Decision Making/ A&P  Medical Decision Making  Patient's EKG per my interpretation shows new ST depression indicating possible new ischemia.  Patient's troponins here are flat.  Given her increasing episodes of chest pain and her history of coronary artery disease, feel that patient would benefit from inpatient evaluation.  Mild hypokalemia noted on electrolytes and will give oral potassium for this.  Patient's chest x-ray per my interpretation shows no acute findings.  Plan will be for hospitalist admission.  Patient's husband at bedside and he is agreeable to this as well        Final Clinical Impression(s) / ED Diagnoses Final diagnoses:  None    Rx / DC Orders ED Discharge Orders     None         Lacretia Leigh, MD 06/04/22 1227

## 2022-06-04 NOTE — Hospital Course (Addendum)
Follow up items: - Stress testing? - Barrett's surveillance  Feel better today. Reports leg pain and nausea. Denies recurrent chest pain since Friday morning. Felt like prior heart attack on Friday.. Some SOB. Good PO intake. Denies chest pain with exertion. Discussed plan to obtain echo and optimizing medical management. Has prior referral to cardiology. Discussed tobacco cessation. Husband reports intermittent leg swelling. Discussed obtaining insurance for increased access to care.

## 2022-06-05 ENCOUNTER — Observation Stay (HOSPITAL_BASED_OUTPATIENT_CLINIC_OR_DEPARTMENT_OTHER): Payer: Self-pay

## 2022-06-05 DIAGNOSIS — Z87891 Personal history of nicotine dependence: Secondary | ICD-10-CM

## 2022-06-05 DIAGNOSIS — R9431 Abnormal electrocardiogram [ECG] [EKG]: Secondary | ICD-10-CM

## 2022-06-05 DIAGNOSIS — R079 Chest pain, unspecified: Secondary | ICD-10-CM

## 2022-06-05 DIAGNOSIS — I209 Angina pectoris, unspecified: Secondary | ICD-10-CM

## 2022-06-05 LAB — CBC
HCT: 40.7 % (ref 36.0–46.0)
Hemoglobin: 13.5 g/dL (ref 12.0–15.0)
MCH: 30.2 pg (ref 26.0–34.0)
MCHC: 33.2 g/dL (ref 30.0–36.0)
MCV: 91.1 fL (ref 80.0–100.0)
Platelets: 278 10*3/uL (ref 150–400)
RBC: 4.47 MIL/uL (ref 3.87–5.11)
RDW: 13.4 % (ref 11.5–15.5)
WBC: 10.2 10*3/uL (ref 4.0–10.5)
nRBC: 0 % (ref 0.0–0.2)

## 2022-06-05 LAB — ECHOCARDIOGRAM COMPLETE
Area-P 1/2: 3.51 cm2
Height: 62 in
S' Lateral: 3 cm
Weight: 2686.4 oz

## 2022-06-05 LAB — BASIC METABOLIC PANEL
Anion gap: 10 (ref 5–15)
BUN: 15 mg/dL (ref 6–20)
CO2: 28 mmol/L (ref 22–32)
Calcium: 9.7 mg/dL (ref 8.9–10.3)
Chloride: 103 mmol/L (ref 98–111)
Creatinine, Ser: 0.87 mg/dL (ref 0.44–1.00)
GFR, Estimated: >60 mL/min (ref >60)
Glucose, Bld: 103 mg/dL — ABNORMAL HIGH (ref 70–99)
Potassium: 3.5 mmol/L (ref 3.5–5.1)
Sodium: 141 mmol/L (ref 135–145)

## 2022-06-05 MED ORDER — VARENICLINE TARTRATE 0.5 MG PO TABS: 0.5000 mg | ORAL_TABLET | Freq: Every day | ORAL | Status: AC

## 2022-06-05 MED ORDER — LISINOPRIL 5 MG PO TABS: 5.0000 mg | ORAL_TABLET | Freq: Every day | ORAL | 2 refills | Status: AC

## 2022-06-05 MED ORDER — NITROGLYCERIN 0.4 MG SL SUBL: 0.4000 mg | SUBLINGUAL_TABLET | SUBLINGUAL | Status: DC | PRN

## 2022-06-05 MED ORDER — NITROGLYCERIN 0.4 MG SL SUBL: 0.4000 mg | SUBLINGUAL_TABLET | SUBLINGUAL | 0 refills | Status: AC | PRN

## 2022-06-05 MED ORDER — ONDANSETRON HCL 4 MG/2ML IJ SOLN
4.0000 mg | Freq: Four times a day (QID) | INTRAMUSCULAR | Status: DC | PRN
  Administered 2022-06-05: 4 mg via INTRAVENOUS
  Filled 2022-06-05: qty 2

## 2022-06-05 MED ORDER — HYDROXYZINE HCL 25 MG PO TABS
25.0000 mg | ORAL_TABLET | Freq: Once | ORAL | Status: AC
  Administered 2022-06-05: 25 mg via ORAL
  Filled 2022-06-05: qty 1

## 2022-06-05 MED ORDER — METOPROLOL TARTRATE 25 MG PO TABS: 25.0000 mg | ORAL_TABLET | Freq: Two times a day (BID) | ORAL | 2 refills | Status: AC

## 2022-06-05 MED ORDER — POTASSIUM CHLORIDE CRYS ER 20 MEQ PO TBCR
40.0000 meq | EXTENDED_RELEASE_TABLET | Freq: Once | ORAL | Status: AC
  Administered 2022-06-05: 40 meq via ORAL
  Filled 2022-06-05 (×2): qty 2

## 2022-06-05 MED ORDER — METOPROLOL TARTRATE 25 MG PO TABS
25.0000 mg | ORAL_TABLET | Freq: Two times a day (BID) | ORAL | Status: DC
  Administered 2022-06-05: 25 mg via ORAL
  Filled 2022-06-05: qty 1

## 2022-06-05 MED ORDER — VARENICLINE TARTRATE 1 MG PO TABS: 1.0000 mg | ORAL_TABLET | Freq: Two times a day (BID) | ORAL | 0 refills | Status: AC

## 2022-06-05 MED ORDER — VARENICLINE TARTRATE 0.5 MG PO TABS: 0.5000 mg | ORAL_TABLET | Freq: Two times a day (BID) | ORAL | 0 refills | Status: AC

## 2022-06-05 MED ORDER — LISINOPRIL 5 MG PO TABS
5.0000 mg | ORAL_TABLET | Freq: Once | ORAL | Status: AC
  Administered 2022-06-05: 5 mg via ORAL
  Filled 2022-06-05: qty 1

## 2022-06-05 NOTE — TOC CM/SW Note (Signed)
[place] letter provided. Patient reports PCP is [facility] in [place], [place]. Has transportation home. No further TOC needs identified.  Marthenia Rolling, MSN, RN,BSN [facility] Case Manager [phone]

## 2022-06-05 NOTE — Progress Notes (Signed)
  Echocardiogram 2D Echocardiogram has been performed.  Jocelyn Richard 06/05/2022, 11:31 AM

## 2022-06-05 NOTE — Discharge Summary (Signed)
Name: Jocelyn Richard MRN: [id number] DOB: [birth year] 56 y.o. PCP: [facility]  Date of Admission: 06/03/2022  8:47 PM Date of Discharge: 06/05/2022 4:48 PM Attending Physician: Axel Filler, *  Discharge Diagnosis: Principal Problem:   Chest pain Active Problems:   GERD (gastroesophageal reflux disease)   Tobacco abuse   Essential hypertension   Hyperlipidemia   CAD S/P percutaneous coronary angioplasty: s/p DES PCI to mLAD   Discharge Medications: Allergies as of 06/05/2022   No Known Allergies      Medication List     STOP taking these medications    clopidogrel 75 MG tablet Commonly known as: PLAVIX       TAKE these medications    acetaminophen 500 MG tablet Commonly known as: TYLENOL Take 1,000 mg by mouth every 6 (six) hours as needed for mild pain.   aspirin EC 81 MG tablet Take 1 tablet (81 mg total) by mouth daily.   atorvastatin 80 MG tablet Commonly known as: LIPITOR TAKE 1 TABLET(80 MG) BY MOUTH DAILY AT 6 PM What changed: See the new instructions.   esomeprazole 40 MG capsule Commonly known as: NEXIUM Take 40 mg by mouth daily.   hydrOXYzine 25 MG tablet Commonly known as: ATARAX Take 25 mg by mouth 4 (four) times daily.   lisinopril 5 MG tablet Commonly known as: ZESTRIL Take 1 tablet (5 mg total) by mouth daily.   metoprolol tartrate 25 MG tablet Commonly known as: LOPRESSOR Take 1 tablet (25 mg total) by mouth 2 (two) times daily. What changed: See the new instructions.   nicotine 14 mg/24hr patch Commonly known as: NICODERM CQ - dosed in mg/24 hours Place 1 patch (14 mg total) onto the skin daily.   nitroGLYCERIN 0.4 MG SL tablet Commonly known as: NITROSTAT Place 1 tablet (0.4 mg total) under the tongue every 5 (five) minutes as needed for chest pain.   varenicline 0.5 MG tablet Commonly known as: CHANTIX Take 1 tablet (0.5 mg total) by mouth daily for 3 doses. Start taking on: June 06, 2022   varenicline 0.5 MG tablet Commonly known as: CHANTIX Take 1 tablet (0.5 mg total) by mouth 2 (two) times daily for 8 doses. Start taking on: June 07, 2022   varenicline 1 MG tablet Commonly known as: CHANTIX Take 1 tablet (1 mg total) by mouth 2 (two) times daily. Start taking on: June 11, 2022        Disposition and follow-up:   Jocelyn Richard is a 56 y.o. year old with history of CAD status post stent to LAD who presents with sudden onset chest pain with nonspecific repolarization changes on EKG, concerning for angina.  Myocardial infarction was ruled out.  Chest pain due to angina Patient was admitted for chest pain of sudden onset and short duration.  It was associated with nausea, vomiting, and diaphoresis.  Fairly typical for angina except that it is not present during exertion.  MI ruled out.  Echocardiogram was normal.  Differential diagnosis includes severe GERD in this patient with history of same and Barrett's esophagus.  She was discharged on the following regimen: - Metoprolol tartrate 25 mg twice daily - Lisinopril 5 mg daily - Nitroglycerin 0.4 mg sublingual every 5 minutes for up to 3 doses as needed for chest pain - Atorvastatin 80 mg daily - Aspirin 81 mg daily - Referral to cardiology  Severe GERD with evidence of Barrett's esophagus Barrett's esophagus established by EGD with  biopsy in 2017.  Patient has not had a repeat EGD for surveillance since that time. - Referral to gastroenterology for EGD  Tobacco use disorder Patient was counseled regarding smoking cessation.  She was started on Chantix while she was hospitalized and prescribed a full course of this medication to be completed in the outpatient setting. - Check in with this patient regarding smoking cessation  Follow-up Appointments:  Follow-up Information     [facility]. Call on 06/07/2022.   Specialty: General Practice Why: Please call for follow-up  appointment with your primary care provider as soon as possible after leaving the hospital. Contact information: [address] [place] [address] [phone]                 Hospital Course by problem list:  Chest pain, concerning for angina Patient presented with sudden onset chest pain that was associated with nausea, vomiting, and diaphoresis.  By the time this team saw her to admit her, her chest pain had resolved and had not returned.  She was noted to have nonspecific repolarization changes on her EKG that were new from her last EKG.  These resolved by the following morning.  Her chest pain was thought to be due to angina given her history of ASCVD with stent to LAD, but her symptoms were atypical in some respects, most notably that her pain is not present on exertion.  An echocardiogram was completed and was found to be normal.  ACS was ruled out.  The patient was discharged on a regimen of metoprolol and lisinopril with as needed nitroglycerin.  Her outpatient medical management is complicated by the fact that she does not have insurance.  Differential diagnosis includes severe GERD, and patient has a history of same.  Per chart review she has been diagnosed with Barrett's esophagus by EGD with biopsy but has not received routine surveillance since that procedure, which was done in 2017.  It will be important for her to follow-up with her primary care physician, a cardiologist, and a gastroenterologist to address all of these problems and get her health back on track.  Discharge Exam:  Subjective: Feels better than yesterday.  She reports anxiety.  She denies chest pain, shortness of breath, nausea, vomiting.   Blood pressure 130/85, pulse 70, temperature 98.1 F (36.7 C), temperature source Oral, resp. rate 19, height 5\' 2"  (1.575 m), weight 76.2 kg, SpO2 98 %. General: In no apparent acute distress. Cardiovascular: Regular rate and rhythm.  No JVD or lower extremity  edema. Respiratory: Breathing is regular and unlabored. Skin: Warm and dry. Neuro: Alert.  No gross focal deficits. Psych: Anxious appearing.  Pertinent studies and procedures:   Latest Reference Range & Units 06/05/22 02:18  Sodium 135 - 145 mmol/L 141  Potassium 3.5 - 5.1 mmol/L 3.5  Chloride 98 - 111 mmol/L 103  CO2 22 - 32 mmol/L 28  Glucose 70 - 99 mg/dL 06/07/22 (H)  BUN 6 - 20 mg/dL 15  Creatinine 322 - 0.25 mg/dL 4.27  Calcium 8.9 - 0.62 mg/dL 9.7  Anion gap 5 - 15  10  GFR, Estimated >60 mL/min >60  (H): Data is abnormally high   Latest Reference Range & Units 06/04/22 14:33  Total CHOL/HDL Ratio RATIO 3.7  Cholesterol 0 - 200 mg/dL 06/06/22  HDL Cholesterol 283 mg/dL 37 (L)  LDL (calc) 0 - 99 mg/dL 75  Triglycerides >15 mg/dL <176  VLDL 0 - 40 mg/dL 25  (L): Data  is abnormally low   Latest Reference Range & Units 06/05/22 02:18  WBC 4.0 - 10.5 K/uL 10.2  RBC 3.87 - 5.11 MIL/uL 4.47  Hemoglobin 12.0 - 15.0 g/dL 16.1  HCT 09.6 - 04.5 % 40.7  MCV 80.0 - 100.0 fL 91.1  MCH 26.0 - 34.0 pg 30.2  MCHC 30.0 - 36.0 g/dL 40.9  RDW 81.1 - 91.4 % 13.4  Platelets 150 - 400 K/uL 278  nRBC 0.0 - 0.2 % 0.0    Latest Reference Range & Units 06/03/22 21:13 06/03/22 23:29  Troponin I (High Sensitivity) <18 ng/L 7 5    Latest Reference Range & Units 06/04/22 14:32  B Natriuretic Peptide 0.0 - 100.0 pg/mL 11.3   Echo:  1. Left ventricular ejection fraction, by estimation, is 65 to 70%. The  left ventricle has normal function. The left ventricle has no regional  wall motion abnormalities. Left ventricular diastolic parameters were  normal.   2. Right ventricular systolic function is normal. The right ventricular  size is normal. Tricuspid regurgitation signal is inadequate for assessing  PA pressure.   3. The mitral valve is normal in structure. No evidence of mitral valve  regurgitation. No evidence of mitral stenosis.   4. The aortic valve was not well visualized. Aortic valve  regurgitation  is not visualized. No aortic stenosis is present.   5. The inferior vena cava is normal in size with greater than 50%  respiratory variability, suggesting right atrial pressure of 3 mmHg.   Discharge Instructions:   Discharge Instructions      Ms. Mathis Fare Oliveira  You were admitted for chest pain.  Given your history of heart disease, we were concerned for chest pain due to heart problem.  However he did not have a heart attack.  We used an ultrasound to take pictures of your heart, which were normal.  We are discharging you home now that you are doing better. To help assist you on your road to recovery, I have written the following recommendations:   The best thing you can do for your heart health, and your overall health, is to quit smoking.  I have prescribed a course of Chantix to help you quit.  If this medicine is expensive, talk to your pharmacist about ways to get it more cheaply.  You will start with 1 low strength tablet every day for 3 days after you leave the hospital, then 2 low strength tablets twice a day for 4 days, to be followed by one half strength tablet twice a day every day for 12 weeks.  I have made the following changes to your medications: - Start metoprolol tartrate 25 mg twice daily - Start lisinopril 5 mg daily  Other causes of chest pain include heartburn due to gastroesophageal reflux disease.  In the past, you have had severe gastroesophageal reflux disease.  Please follow-up with your primary care doctor to discuss this problem.  This is also important because gastroesophageal reflux disease can cause esophageal cancer, which is an uncommon but very serious cause of chest pain.  Please return to the hospital for new or worsening chest pain, shortness of breath, fainting.  It was a privilege to be a part of your hospital care team, and I hope you feel better as a result of your stay.  All the best, Marrianne Mood,  MD     Signed: Marrianne Mood MD 06/05/2022, 4:48 PM   Pager: [phone]

## 2022-06-05 NOTE — Discharge Instructions (Addendum)
Ms. Lacheryl Niesen [facility]  You were admitted for chest pain.  Given your history of heart disease, we were concerned for chest pain due to heart problem.  However he did not have a heart attack.  We used an ultrasound to take pictures of your heart, which were normal.  We are discharging you home now that you are doing better. To help assist you on your road to recovery, I have written the following recommendations:   The best thing you can do for your heart health, and your overall health, is to quit smoking.  I have prescribed a course of Chantix to help you quit.  If this medicine is expensive, talk to your pharmacist about ways to get it more cheaply.  You will start with 1 low strength tablet every day for 3 days after you leave the hospital, then 2 low strength tablets twice a day for 4 days, to be followed by one half strength tablet twice a day every day for 12 weeks.  I have made the following changes to your medications: - Start metoprolol tartrate 25 mg twice daily - Start lisinopril 5 mg daily  Other causes of chest pain include heartburn due to gastroesophageal reflux disease.  In the past, you have had severe gastroesophageal reflux disease.  Please follow-up with your primary care doctor to discuss this problem.  This is also important because gastroesophageal reflux disease can cause esophageal cancer, which is an uncommon but very serious cause of chest pain.  Please return to the hospital for new or worsening chest pain, shortness of breath, fainting.  It was a privilege to be a part of your hospital care team, and I hope you feel better as a result of your stay.  All the best, Nani Gasser, MD
